# Patient Record
Sex: Male | Born: 1980
Health system: Southern US, Community
[De-identification: ages and names within clinical notes are randomized; demographics above are authoritative.]

## PROBLEM LIST (undated history)

## (undated) ENCOUNTER — Ambulatory Visit: Payer: Medicaid Other | Source: Home / Self Care

## (undated) DIAGNOSIS — I10 Essential (primary) hypertension: Secondary | ICD-10-CM

## (undated) DIAGNOSIS — K5792 Diverticulitis of intestine, part unspecified, without perforation or abscess without bleeding: Secondary | ICD-10-CM

## (undated) DIAGNOSIS — E119 Type 2 diabetes mellitus without complications: Secondary | ICD-10-CM

## (undated) HISTORY — PX: CHEST TUBE INSERTION: SHX231

---
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
## 2000-04-03 ENCOUNTER — Emergency Department (HOSPITAL_COMMUNITY): Admission: EM | Admit: 2000-04-03 | Discharge: 2000-04-03 | Payer: Self-pay | Admitting: Emergency Medicine

## 2003-05-01 ENCOUNTER — Emergency Department (HOSPITAL_COMMUNITY): Admission: EM | Admit: 2003-05-01 | Discharge: 2003-05-01 | Payer: Self-pay | Admitting: Emergency Medicine

## 2003-12-31 ENCOUNTER — Emergency Department (HOSPITAL_COMMUNITY): Admission: EM | Admit: 2003-12-31 | Discharge: 2003-12-31 | Payer: Self-pay | Admitting: Emergency Medicine

## 2004-03-04 ENCOUNTER — Emergency Department (HOSPITAL_COMMUNITY): Admission: EM | Admit: 2004-03-04 | Discharge: 2004-03-04 | Payer: Self-pay | Admitting: Emergency Medicine

## 2005-11-07 ENCOUNTER — Emergency Department (HOSPITAL_COMMUNITY): Admission: EM | Admit: 2005-11-07 | Discharge: 2005-11-07 | Payer: Self-pay | Admitting: *Deleted

## 2006-09-06 ENCOUNTER — Emergency Department (HOSPITAL_COMMUNITY): Admission: EM | Admit: 2006-09-06 | Discharge: 2006-09-06 | Payer: Self-pay | Admitting: Emergency Medicine

## 2008-06-25 ENCOUNTER — Emergency Department (HOSPITAL_COMMUNITY): Admission: EM | Admit: 2008-06-25 | Discharge: 2008-06-25 | Payer: Self-pay | Admitting: Emergency Medicine

## 2010-05-26 LAB — CBC
HCT: 45.2 % (ref 39.0–52.0)
Hemoglobin: 15.2 g/dL (ref 13.0–17.0)
MCHC: 33.8 g/dL (ref 30.0–36.0)
RBC: 5.66 MIL/uL (ref 4.22–5.81)
RDW: 14.2 % (ref 11.5–15.5)

## 2010-05-26 LAB — BASIC METABOLIC PANEL
CO2: 25 mEq/L (ref 19–32)
Glucose, Bld: 149 mg/dL — ABNORMAL HIGH (ref 70–99)
Potassium: 3.6 mEq/L (ref 3.5–5.1)
Sodium: 138 mEq/L (ref 135–145)

## 2010-05-26 LAB — DIFFERENTIAL
Basophils Absolute: 0 10*3/uL (ref 0.0–0.1)
Basophils Relative: 0 % (ref 0–1)
Eosinophils Relative: 3 % (ref 0–5)
Lymphocytes Relative: 46 % (ref 12–46)
Monocytes Absolute: 0.7 10*3/uL (ref 0.1–1.0)
Monocytes Relative: 12 % (ref 3–12)

## 2010-05-26 LAB — TROPONIN I: Troponin I: 0.01 ng/mL (ref 0.00–0.06)

## 2010-05-26 LAB — URINALYSIS, ROUTINE W REFLEX MICROSCOPIC
Bilirubin Urine: NEGATIVE
Hgb urine dipstick: NEGATIVE
Ketones, ur: NEGATIVE mg/dL
Nitrite: NEGATIVE
pH: 6 (ref 5.0–8.0)

## 2010-05-26 LAB — CK TOTAL AND CKMB (NOT AT ARMC): Total CK: 427 U/L — ABNORMAL HIGH (ref 7–232)

## 2010-05-26 LAB — RAPID URINE DRUG SCREEN, HOSP PERFORMED
Amphetamines: NOT DETECTED
Cocaine: POSITIVE — AB
Opiates: NOT DETECTED
Tetrahydrocannabinol: POSITIVE — AB

## 2010-06-30 NOTE — H&P (Signed)
NAME:  Alan Williams, Alan Williams NO.:  [id number]   MEDICAL RECORD NO.:  [id number]          PATIENT TYPE:  EMS   LOCATION:  MAJO                         FACILITY:  [facility]   PHYSICIAN:  Renee Ramus, MD       DATE OF BIRTH:  [birth year]   DATE OF ADMISSION:  06/25/2008  DATE OF DISCHARGE:                              HISTORY & PHYSICAL   HISTORY OF PRESENT ILLNESS:  The patient is a 30 year old male with  chest pain, which he thinks is episodic x1 month prior to admission.  The patient reports increased pain with cocaine use.  He also reports  some trouble breathing and heaviness in his chest.  The patient said  that this has been going on for approximately 4 weeks prior to  admission.  He says that he does not have any associated dyspnea on  exertion, dyspnea at baseline.  No cough, fevers, chills, night sweats,  nausea, or vomiting.  No diarrhea or constipation.  The patient has no  other medical comorbidities.  The patient does admit to recent cocaine  use.  The patient has negative EKG, negative enzymes, and a chest x-ray  that shows some pulmonary edema.   PAST MEDICAL HISTORY:  Blood pressure for which he is not treated and  drug use.   SOCIAL HISTORY:  The patient admits to using cocaine, although he says  not all the time.  The patient also reports marijuana use.   FAMILY HISTORY:  Not available.   REVIEW OF SYSTEMS:  All other comprehensive review of systems are  negative.   MEDICATIONS:  Currently none.   ALLERGIES:  He has no known drug allergies.   PHYSICAL EXAMINATION:  GENERAL:  This is a well-developed and well-  nourished black male currently in no apparent distress.  VITAL SIGNS:  Blood pressure 142/81, heart rate 91, respiratory rate 17,  and temperature 98.1.  HEENT:  No jugular venous distention or lymphadenopathy.  Oropharynx is  clear.  Mucous membranes are pink and moist.  TMs are clear bilaterally.  Pupils equal and reactive to light and  accommodation.  Extraocular  muscles are intact.  CARDIOVASCULAR:  He has a regular rate and rhythm without murmurs, rubs,  or gallops.  PULMONARY:  Lungs are clear to auscultation bilaterally.  ABDOMEN:  Soft, moderately obese, nontender, nondistended without  hepatosplenomegaly.  Bowel sounds are present.  He has no rebound or  guarding.  EXTREMITIES:  He has no clubbing, cyanosis, or edema.  He has good  peripheral pulses in dorsalis pedis and radial arteries.  He is able to  move all extremities.  NEUROLOGIC:  Cranial nerves II-XII are grossly intact.  He has no focal  neurological deficits.   STUDIES:  1. Chest x-ray shows mild pulmonary edema bilaterally.  2. EKG shows normal sinus rhythm with concave down ST segment      elevations.   LABORATORY DATA:  White count 5.6, H and H 15/45, MCV 80, plates 621.  Sodium 138, potassium 2.6, chloride 102, bicarb 28, BUN 12, creatinine  1.0, glucose 149.  UA  is clear.   ASSESSMENT:  1. Chest pain, currently resolved.  We think this is secondary to      transient pulmonary edema from cocaine use.  The patient has been      advised not to use cocaine.  He has been advised to follow up with      an established care at [facility] for further evaluation and      treatment if needed.  Certainly, his blood pressure and elevated      blood sugar needs to be further investigated and he should be      screened for cardiac risk factors including high cholesterol.  2. Drug use as above.  3. Increased blood glucose as above.  4. Hypertension as above.   DISPOSITION:  The patient will be discharged to home with instructions  to follow up with primary care physician.  H and P was constructed by  reviewing past medical history, conferring with emergency medical room  physician, and reviewing the emergency medical record.   TIME SPENT:  1 hour.      Renee Ramus, MD  Electronically Signed     JF/MEDQ  D:  06/25/2008  T:  06/26/2008  Job:   [id number]

## 2010-07-02 ENCOUNTER — Emergency Department (HOSPITAL_COMMUNITY)
Admission: EM | Admit: 2010-07-02 | Discharge: 2010-07-02 | Discharge status: Against Medical Advice | Payer: Self-pay | Attending: Emergency Medicine | Admitting: Emergency Medicine

## 2010-07-02 ENCOUNTER — Emergency Department (HOSPITAL_COMMUNITY): Payer: Self-pay

## 2010-07-02 DIAGNOSIS — R0602 Shortness of breath: Secondary | ICD-10-CM | POA: Insufficient documentation

## 2010-08-26 ENCOUNTER — Inpatient Hospital Stay (HOSPITAL_COMMUNITY)
Admission: EM | Admit: 2010-08-26 | Discharge: 2010-08-28 | DRG: 313 | Disposition: A | Discharge status: Home / Self Care | Payer: Self-pay | Attending: Internal Medicine | Admitting: Internal Medicine

## 2010-08-26 ENCOUNTER — Emergency Department (HOSPITAL_COMMUNITY): Payer: Self-pay

## 2010-08-26 DIAGNOSIS — K219 Gastro-esophageal reflux disease without esophagitis: Secondary | ICD-10-CM | POA: Diagnosis present

## 2010-08-26 DIAGNOSIS — F17200 Nicotine dependence, unspecified, uncomplicated: Secondary | ICD-10-CM | POA: Diagnosis present

## 2010-08-26 DIAGNOSIS — I10 Essential (primary) hypertension: Secondary | ICD-10-CM | POA: Diagnosis present

## 2010-08-26 DIAGNOSIS — T405X1A Poisoning by cocaine, accidental (unintentional), initial encounter: Secondary | ICD-10-CM | POA: Diagnosis present

## 2010-08-26 DIAGNOSIS — R0789 Other chest pain: Principal | ICD-10-CM | POA: Diagnosis present

## 2010-08-26 LAB — BASIC METABOLIC PANEL
BUN: 16 mg/dL (ref 6–23)
CO2: 29 mEq/L (ref 19–32)
Chloride: 99 mEq/L (ref 96–112)
Creatinine, Ser: 1.09 mg/dL (ref 0.50–1.35)
GFR calc Af Amer: >60 mL/min (ref >60)
Potassium: 3.6 mEq/L (ref 3.5–5.1)

## 2010-08-26 LAB — CBC
HCT: 45.6 % (ref 39.0–52.0)
MCH: 26.5 pg (ref 26.0–34.0)
MCHC: 34 g/dL (ref 30.0–36.0)
MCV: 78.1 fL (ref 78.0–100.0)
Platelets: 279 10*3/uL (ref 150–400)
RDW: 13.8 % (ref 11.5–15.5)
WBC: 8.1 10*3/uL (ref 4.0–10.5)

## 2010-08-26 LAB — DIFFERENTIAL
Eosinophils Absolute: 0.2 10*3/uL (ref 0.0–0.7)
Eosinophils Relative: 2 % (ref 0–5)
Lymphocytes Relative: 45 % (ref 12–46)
Lymphs Abs: 3.6 10*3/uL (ref 0.7–4.0)
Monocytes Absolute: 0.9 10*3/uL (ref 0.1–1.0)
Monocytes Relative: 11 % (ref 3–12)

## 2010-08-26 LAB — CK TOTAL AND CKMB (NOT AT ARMC): Relative Index: 0.6 (ref 0.0–2.5)

## 2010-08-27 DIAGNOSIS — R072 Precordial pain: Secondary | ICD-10-CM

## 2010-08-27 LAB — URINALYSIS, ROUTINE W REFLEX MICROSCOPIC
Bilirubin Urine: NEGATIVE
Ketones, ur: NEGATIVE mg/dL
Leukocytes, UA: NEGATIVE
Nitrite: NEGATIVE
Specific Gravity, Urine: 1.018 (ref 1.005–1.030)
Urobilinogen, UA: 0.2 mg/dL (ref 0.0–1.0)

## 2010-08-27 LAB — CARDIAC PANEL(CRET KIN+CKTOT+MB+TROPI)
CK, MB: 3 ng/mL (ref 0.3–4.0)
Relative Index: 0.7 (ref 0.0–2.5)
Troponin I: <0.3 ng/mL (ref <0.30)
Troponin I: <0.3 ng/mL (ref <0.30)

## 2010-08-27 LAB — CBC
MCH: 25.3 pg — ABNORMAL LOW (ref 26.0–34.0)
MCV: 79.1 fL (ref 78.0–100.0)
Platelets: 255 10*3/uL (ref 150–400)
RBC: 5.7 MIL/uL (ref 4.22–5.81)
RDW: 13.7 % (ref 11.5–15.5)
WBC: 7.5 10*3/uL (ref 4.0–10.5)

## 2010-08-27 LAB — URINE DRUGS OF ABUSE SCREEN W ALC, ROUTINE (REF LAB)
Benzodiazepines.: NEGATIVE
Ethyl Alcohol: <10 mg/dL (ref <10)
Opiate Screen, Urine: NEGATIVE
Phencyclidine (PCP): NEGATIVE

## 2010-08-27 LAB — COMPREHENSIVE METABOLIC PANEL
Albumin: 3.6 g/dL (ref 3.5–5.2)
Alkaline Phosphatase: 57 U/L (ref 39–117)
BUN: 15 mg/dL (ref 6–23)
CO2: 25 mEq/L (ref 19–32)
Chloride: 102 mEq/L (ref 96–112)
GFR calc non Af Amer: >60 mL/min (ref >60)
Potassium: 4.5 mEq/L (ref 3.5–5.1)
Total Bilirubin: 0.4 mg/dL (ref 0.3–1.2)

## 2010-08-27 LAB — LIPID PANEL
Cholesterol: 143 mg/dL (ref 0–200)
Total CHOL/HDL Ratio: 3.6 RATIO
Triglycerides: 249 mg/dL — ABNORMAL HIGH (ref <150)
VLDL: 50 mg/dL — ABNORMAL HIGH (ref 0–40)

## 2010-08-27 LAB — MAGNESIUM: Magnesium: 2 mg/dL (ref 1.5–2.5)
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
## 2010-08-28 LAB — BASIC METABOLIC PANEL
BUN: 13 mg/dL (ref 6–23)
Calcium: 9.5 mg/dL (ref 8.4–10.5)
Creatinine, Ser: 1.16 mg/dL (ref 0.50–1.35)
GFR calc Af Amer: >60 mL/min (ref >60)
GFR calc non Af Amer: >60 mL/min (ref >60)

## 2010-08-28 LAB — CBC
MCHC: 31.9 g/dL (ref 30.0–36.0)
RDW: 13.6 % (ref 11.5–15.5)
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
## 2010-09-01 LAB — COCAINE, URINE, CONFIRMATION: Benzoylecgonine GC/MS Conf: 8192 NG/ML — ABNORMAL HIGH

## 2010-09-01 LAB — THC (MARIJUANA), URINE, CONFIRMATION: Marijuana, Ur-Confirmation: 36 NG/ML — ABNORMAL HIGH

## 2012-02-28 ENCOUNTER — Emergency Department (HOSPITAL_COMMUNITY)
Admission: EM | Admit: 2012-02-28 | Discharge: 2012-02-28 | Disposition: A | Discharge status: Home / Self Care | Payer: Self-pay | Attending: Emergency Medicine | Admitting: Emergency Medicine

## 2012-02-28 ENCOUNTER — Encounter (HOSPITAL_COMMUNITY): Payer: Self-pay | Admitting: Emergency Medicine

## 2012-02-28 DIAGNOSIS — H109 Unspecified conjunctivitis: Secondary | ICD-10-CM | POA: Insufficient documentation

## 2012-02-28 DIAGNOSIS — H5789 Other specified disorders of eye and adnexa: Secondary | ICD-10-CM | POA: Insufficient documentation

## 2012-02-28 DIAGNOSIS — F17200 Nicotine dependence, unspecified, uncomplicated: Secondary | ICD-10-CM | POA: Insufficient documentation

## 2012-02-28 DIAGNOSIS — H53149 Visual discomfort, unspecified: Secondary | ICD-10-CM | POA: Insufficient documentation

## 2012-02-28 DIAGNOSIS — I10 Essential (primary) hypertension: Secondary | ICD-10-CM | POA: Insufficient documentation

## 2012-02-28 DIAGNOSIS — R0982 Postnasal drip: Secondary | ICD-10-CM | POA: Insufficient documentation

## 2012-02-28 DIAGNOSIS — R6889 Other general symptoms and signs: Secondary | ICD-10-CM | POA: Insufficient documentation

## 2012-02-28 DIAGNOSIS — J3489 Other specified disorders of nose and nasal sinuses: Secondary | ICD-10-CM | POA: Insufficient documentation

## 2012-02-28 DIAGNOSIS — J069 Acute upper respiratory infection, unspecified: Secondary | ICD-10-CM | POA: Insufficient documentation

## 2012-02-28 HISTORY — DX: Essential (primary) hypertension: I10

## 2012-02-28 MED ORDER — POLYMYXIN B-TRIMETHOPRIM 10000-0.1 UNIT/ML-% OP SOLN: 1.0000 [drp] | OPHTHALMIC | Status: DC

## 2012-02-28 NOTE — ED Provider Notes (Signed)
History     CSN: [id number]  Arrival date & time 02/28/12  4098   First MD Initiated Contact with Patient 02/28/12 [phone]      Chief Complaint  Patient presents with  . Conjunctivitis    (Consider location/radiation/quality/duration/timing/severity/associated sxs/prior treatment) Patient is a 32 y.o. male presenting with conjunctivitis. The history is provided by the patient.  Conjunctivitis  The current episode started yesterday. The onset was sudden. The problem has been gradually worsening. The problem is moderate. Nothing relieves the symptoms. The symptoms are aggravated by light (and visine). Associated symptoms include eye itching, photophobia, congestion, rhinorrhea, eye discharge and eye redness. Pertinent negatives include no fever, no ear pain, no headaches, no sore throat, no neck pain and no wheezing.  the patient states that he was exposed to his niece 2 days prior to onset, whom he believes had pink eye.  He awoke on Sunday with matted eyelids and redness, itching and irritation.  He also has a cold, with runny nose and nasal congestion.  He has noticed yellow "gunk" in the corners of his eyes. He states he has no loss of vision or visual disturbances.  His eyes are itchy, red and sensitive to light.     Past Medical History  Diagnosis Date  . Hypertension     Past Surgical History  Procedure Date  . Chest tube insertion     No family history on file.  History  Substance Use Topics  . Smoking status: Current Some Day Smoker  . Smokeless tobacco: Not on file  . Alcohol Use: Yes      Review of Systems  Constitutional: Negative for fever and appetite change.  HENT: Positive for congestion, rhinorrhea and postnasal drip. Negative for ear pain, sore throat, neck pain and neck stiffness.   Eyes: Positive for photophobia, discharge, redness and itching. Negative for visual disturbance.  Respiratory: Negative for chest tightness, shortness of breath and wheezing.     Cardiovascular: Negative for chest pain and palpitations.  Neurological: Negative for dizziness and headaches.    Allergies  Review of patient's allergies indicates not on file.  Home Medications  No current outpatient prescriptions on file.  BP 139/108  Pulse 80  Temp 98 F (36.7 C) (Oral)  Resp 18  Ht 5\' 6"  (1.676 m)  Wt 261 lb (118.389 kg)  BMI 42.13 kg/m2  SpO2 97%  Physical Exam  Constitutional: He is oriented to person, place, and time. He appears well-developed and well-nourished.  HENT:  Right Ear: Tympanic membrane normal.  Left Ear: Tympanic membrane normal.  Mouth/Throat: Oropharynx is clear and moist. No oropharyngeal exudate or posterior oropharyngeal erythema.  Eyes: EOM are normal. Pupils are equal, round, and reactive to light. Right eye exhibits discharge. Left eye exhibits discharge. Right conjunctiva is injected. Left conjunctiva is injected.  Cardiovascular: Normal rate, regular rhythm and normal heart sounds.   Pulmonary/Chest: Effort normal and breath sounds normal.  Lymphadenopathy:    He has no cervical adenopathy.  Neurological: He is alert and oriented to person, place, and time.  Skin: Skin is warm and dry.    ED Course  Procedures (including critical care time)  Labs Reviewed - No data to display No results found.   No diagnosis found. 1. Bilateral conjunctivitis 2. Viral URI   MDM  Patient has conjunctivitis exposure and presents with bilateral red eyes, yellow discharge, itching, no vision changes.  Visual acuity 20/25 bilaterally.  Given antibiotic eye drops.  Instructed to return to ED  if not better in 3-5 days on antibiotics.  Also has URI, probably viral.  Afebrile.             Kayelyn Lemon, PA-C 02/28/12 1028  Kehaulani Fruin, PA-C 02/28/12 1030  Wealthy Danielski, PA-C 02/28/12 1037

## 2012-02-28 NOTE — ED Provider Notes (Signed)
Medical screening examination/treatment/procedure(s) were performed by non-physician practitioner and as supervising physician I was immediately available for consultation/collaboration.   Charles B. Sheldon, MD 02/28/12 1530 

## 2012-02-28 NOTE — ED Notes (Signed)
C/o bilat eye pain and drainage X2d, no other complaints, NAD

## 2012-02-28 NOTE — ED Notes (Signed)
Pt was around a child that had "pink eye" this weekend. Pt woke with bilateral eye redness, scratchiness and drainage.

## 2013-04-06 ENCOUNTER — Encounter (HOSPITAL_COMMUNITY): Payer: Self-pay | Admitting: Emergency Medicine

## 2013-04-06 ENCOUNTER — Emergency Department (HOSPITAL_COMMUNITY)
Admission: EM | Admit: 2013-04-06 | Discharge: 2013-04-06 | Disposition: A | Discharge status: Home / Self Care | Payer: Self-pay | Attending: Emergency Medicine | Admitting: Emergency Medicine

## 2013-04-06 ENCOUNTER — Emergency Department (HOSPITAL_COMMUNITY): Payer: Self-pay

## 2013-04-06 DIAGNOSIS — R52 Pain, unspecified: Secondary | ICD-10-CM | POA: Insufficient documentation

## 2013-04-06 DIAGNOSIS — M25569 Pain in unspecified knee: Secondary | ICD-10-CM | POA: Insufficient documentation

## 2013-04-06 DIAGNOSIS — F17200 Nicotine dependence, unspecified, uncomplicated: Secondary | ICD-10-CM | POA: Insufficient documentation

## 2013-04-06 DIAGNOSIS — I10 Essential (primary) hypertension: Secondary | ICD-10-CM | POA: Insufficient documentation

## 2013-04-06 MED ORDER — HYDROCODONE-ACETAMINOPHEN 5-325 MG PO TABS: 1.0000 | ORAL_TABLET | Freq: Four times a day (QID) | ORAL | Status: DC | PRN

## 2013-04-06 MED ORDER — PREDNISONE 50 MG PO TABS: 50.0000 mg | ORAL_TABLET | Freq: Every day | ORAL | Status: DC

## 2013-04-06 NOTE — Discharge Instructions (Signed)
Return here as needed. Follow up with the orthopedist provided. Ice and heat to the knee.  °

## 2013-04-06 NOTE — ED Provider Notes (Signed)
CSN: [id number]     Arrival date & time 04/06/13  1653 History   First MD Initiated Contact with Patient 04/06/13 1750     Chief Complaint  Patient presents with  . Knee Pain     (Consider location/radiation/quality/duration/timing/severity/associated sxs/prior Treatment) Patient is a 33 y.o. male presenting with knee pain.  Knee Pain  33 yo black male with PMH with hypertension presents to the ED with left knee pain for 2 weeks. Patient states that his pain is sharp 4/10 and exacerbated when he walks. He doesn't remember a specific injury that caused this. Patient is able to ambulate but describes it being painful. He describes that  Patient denies nausea, vomiting, fever, or myalgias.    Past Medical History  Diagnosis Date  . Hypertension    Past Surgical History  Procedure Laterality Date  . Chest tube insertion     No family history on file. History  Substance Use Topics  . Smoking status: Current Some Day Smoker  . Smokeless tobacco: Not on file  . Alcohol Use: Yes    Review of Systems All other systems negative except as documented in the HPI. All pertinent positives and negatives as reviewed in the HPI.    Allergies  Review of patient's allergies indicates no known allergies.  Home Medications   Current Outpatient Rx  Name  Route  Sig  Dispense  Refill  . acetaminophen (TYLENOL) 325 MG tablet   Oral   Take 650 mg by mouth every 6 (six) hours as needed (pain).          BP 161/97  Pulse 98  Temp(Src) 97.9 F (36.6 C) (Oral)  Resp 18  SpO2 98% Physical Exam  Nursing note and vitals reviewed. Constitutional: He is oriented to person, place, and time. He appears well-developed and well-nourished. No distress.  HENT:  Head: Normocephalic and atraumatic.  Neck: Normal range of motion. Neck supple.  Cardiovascular: Normal rate and regular rhythm.   Pulmonary/Chest: Effort normal and breath sounds normal.  Musculoskeletal:       Left knee: He exhibits  swelling and effusion. He exhibits normal range of motion, no laceration, no erythema, normal alignment, no LCL laxity and no MCL laxity. Tenderness found.  Neurological: He is alert and oriented to person, place, and time.  Skin: Skin is warm and dry. He is not diaphoretic.    ED Course  Procedures (including critical care time) Labs Review Labs Reviewed - No data to display Imaging Review Dg Knee Complete 4 Views Left  04/06/2013   CLINICAL DATA:  Left knee pain.  EXAM: LEFT KNEE - COMPLETE 4+ VIEW  COMPARISON:  None.  FINDINGS: There is no evidence of fracture, dislocation, or joint effusion. There is no evidence of arthropathy or other focal bone abnormality. Soft tissues are unremarkable.  IMPRESSION: Negative.   Electronically Signed   By: Myles RosenthalJohn  Stahl M.D.   On: 04/06/2013 18:15   Return here as needed. Follow up with ortho as directed. Ice and elevate the knee.    Carlyle Dollyhristopher W Laquida Cotrell, PA-C 04/06/13 [phone]

## 2013-04-06 NOTE — ED Provider Notes (Signed)
Medical screening examination/treatment/procedure(s) were performed by non-physician practitioner and as supervising physician I was immediately available for consultation/collaboration.  EKG Interpretation   None         Rolan BuccoMelanie Krisy Dix, MD 04/06/13 2015

## 2013-04-06 NOTE — ED Notes (Signed)
Pt reports left knee pain that has been present since an injury in high school. Pt reports that about two weeks ago his pain worsened. Pt denies falling, however states that he may have stepped off a step wrong. Pt is A/O x4, in NAD, and vitals are WDL.

## 2014-04-23 ENCOUNTER — Emergency Department (HOSPITAL_COMMUNITY): Payer: Medicaid Other

## 2014-04-23 ENCOUNTER — Encounter (HOSPITAL_COMMUNITY): Payer: Self-pay | Admitting: Emergency Medicine

## 2014-04-23 ENCOUNTER — Inpatient Hospital Stay (HOSPITAL_COMMUNITY)
Admission: EM | Admit: 2014-04-23 | Discharge: 2014-04-25 | DRG: 392 | Disposition: A | Discharge status: Home / Self Care | Payer: Medicaid Other | Attending: Internal Medicine | Admitting: Internal Medicine

## 2014-04-23 DIAGNOSIS — E860 Dehydration: Secondary | ICD-10-CM | POA: Diagnosis present

## 2014-04-23 DIAGNOSIS — K5790 Diverticulosis of intestine, part unspecified, without perforation or abscess without bleeding: Secondary | ICD-10-CM

## 2014-04-23 DIAGNOSIS — N182 Chronic kidney disease, stage 2 (mild): Secondary | ICD-10-CM | POA: Diagnosis present

## 2014-04-23 DIAGNOSIS — K5900 Constipation, unspecified: Secondary | ICD-10-CM | POA: Diagnosis present

## 2014-04-23 DIAGNOSIS — F17210 Nicotine dependence, cigarettes, uncomplicated: Secondary | ICD-10-CM | POA: Diagnosis present

## 2014-04-23 DIAGNOSIS — N179 Acute kidney failure, unspecified: Secondary | ICD-10-CM | POA: Diagnosis present

## 2014-04-23 DIAGNOSIS — K5720 Diverticulitis of large intestine with perforation and abscess without bleeding: Principal | ICD-10-CM | POA: Diagnosis present

## 2014-04-23 DIAGNOSIS — Z6839 Body mass index (BMI) 39.0-39.9, adult: Secondary | ICD-10-CM

## 2014-04-23 DIAGNOSIS — R12 Heartburn: Secondary | ICD-10-CM

## 2014-04-23 DIAGNOSIS — K219 Gastro-esophageal reflux disease without esophagitis: Secondary | ICD-10-CM | POA: Diagnosis present

## 2014-04-23 DIAGNOSIS — Z79899 Other long term (current) drug therapy: Secondary | ICD-10-CM

## 2014-04-23 DIAGNOSIS — E119 Type 2 diabetes mellitus without complications: Secondary | ICD-10-CM

## 2014-04-23 DIAGNOSIS — D72829 Elevated white blood cell count, unspecified: Secondary | ICD-10-CM | POA: Diagnosis present

## 2014-04-23 DIAGNOSIS — E669 Obesity, unspecified: Secondary | ICD-10-CM

## 2014-04-23 HISTORY — DX: Type 2 diabetes mellitus without complications: E11.9

## 2014-04-23 LAB — CBC WITH DIFFERENTIAL/PLATELET
Basophils Absolute: 0 10*3/uL (ref 0.0–0.1)
Basophils Relative: 0 % (ref 0–1)
Eosinophils Absolute: 0.1 10*3/uL (ref 0.0–0.7)
Eosinophils Relative: 1 % (ref 0–5)
HEMATOCRIT: 45.5 % (ref 39.0–52.0)
Hemoglobin: 14.9 g/dL (ref 13.0–17.0)
LYMPHS PCT: 14 % (ref 12–46)
Lymphs Abs: 2.1 10*3/uL (ref 0.7–4.0)
MCH: 26.4 pg (ref 26.0–34.0)
MCHC: 32.7 g/dL (ref 30.0–36.0)
MCV: 80.5 fL (ref 78.0–100.0)
MONO ABS: 1.7 10*3/uL — AB (ref 0.1–1.0)
MONOS PCT: 11 % (ref 3–12)
Neutro Abs: 11.1 10*3/uL — ABNORMAL HIGH (ref 1.7–7.7)
Neutrophils Relative %: 74 % (ref 43–77)
Platelets: 282 10*3/uL (ref 150–400)
RBC: 5.65 MIL/uL (ref 4.22–5.81)
RDW: 13.4 % (ref 11.5–15.5)
WBC: 14.9 10*3/uL — ABNORMAL HIGH (ref 4.0–10.5)

## 2014-04-23 LAB — COMPREHENSIVE METABOLIC PANEL
ALT: 27 U/L (ref 0–53)
AST: 28 U/L (ref 0–37)
Albumin: 4.4 g/dL (ref 3.5–5.2)
Alkaline Phosphatase: 69 U/L (ref 39–117)
Anion gap: 9 (ref 5–15)
BILIRUBIN TOTAL: 0.8 mg/dL (ref 0.3–1.2)
BUN: 16 mg/dL (ref 6–23)
CALCIUM: 9.4 mg/dL (ref 8.4–10.5)
CHLORIDE: 105 mmol/L (ref 96–112)
CO2: 23 mmol/L (ref 19–32)
Creatinine, Ser: 1.65 mg/dL — ABNORMAL HIGH (ref 0.50–1.35)
GFR calc Af Amer: 61 mL/min — ABNORMAL LOW (ref >90)
GFR calc non Af Amer: 52 mL/min — ABNORMAL LOW (ref >90)
GLUCOSE: 103 mg/dL — AB (ref 70–99)
POTASSIUM: 3.5 mmol/L (ref 3.5–5.1)
SODIUM: 137 mmol/L (ref 135–145)
Total Protein: 8.6 g/dL — ABNORMAL HIGH (ref 6.0–8.3)

## 2014-04-23 LAB — URINALYSIS, ROUTINE W REFLEX MICROSCOPIC
BILIRUBIN URINE: NEGATIVE
GLUCOSE, UA: NEGATIVE mg/dL
HGB URINE DIPSTICK: NEGATIVE
KETONES UR: NEGATIVE mg/dL
Leukocytes, UA: NEGATIVE
Nitrite: NEGATIVE
PROTEIN: 30 mg/dL — AB
Specific Gravity, Urine: 1.015 (ref 1.005–1.030)
Urobilinogen, UA: 0.2 mg/dL (ref 0.0–1.0)
pH: 5.5 (ref 5.0–8.0)

## 2014-04-23 LAB — URINE MICROSCOPIC-ADD ON

## 2014-04-23 LAB — LIPASE, BLOOD: LIPASE: 19 U/L (ref 11–59)

## 2014-04-23 LAB — CBG MONITORING, ED: Glucose-Capillary: 94 mg/dL (ref 70–99)

## 2014-04-23 MED ORDER — IOHEXOL 300 MG/ML  SOLN
50.0000 mL | Freq: Once | INTRAMUSCULAR | Status: AC | PRN
  Administered 2014-04-23: 50 mL via ORAL

## 2014-04-23 MED ORDER — IOHEXOL 300 MG/ML  SOLN
100.0000 mL | Freq: Once | INTRAMUSCULAR | Status: AC | PRN
  Administered 2014-04-23: 100 mL via INTRAVENOUS

## 2014-04-23 MED ORDER — MORPHINE SULFATE 4 MG/ML IJ SOLN
6.0000 mg | Freq: Once | INTRAMUSCULAR | Status: AC
  Administered 2014-04-23: 6 mg via INTRAVENOUS
  Filled 2014-04-23: qty 2

## 2014-04-23 MED ORDER — METRONIDAZOLE IN NACL 5-0.79 MG/ML-% IV SOLN
500.0000 mg | Freq: Once | INTRAVENOUS | Status: AC
  Administered 2014-04-23: 500 mg via INTRAVENOUS
  Filled 2014-04-23: qty 100

## 2014-04-23 MED ORDER — ONDANSETRON HCL 4 MG/2ML IJ SOLN
4.0000 mg | Freq: Once | INTRAMUSCULAR | Status: AC
  Administered 2014-04-23: 4 mg via INTRAVENOUS
  Filled 2014-04-23: qty 2

## 2014-04-23 MED ORDER — CIPROFLOXACIN IN D5W 400 MG/200ML IV SOLN
400.0000 mg | Freq: Once | INTRAVENOUS | Status: AC
  Administered 2014-04-23: 400 mg via INTRAVENOUS
  Filled 2014-04-23: qty 200

## 2014-04-23 NOTE — Progress Notes (Signed)
EDCM spoke to patient at bedside.  Patient confirms his pcp is located at the [facility].  System updated.

## 2014-04-23 NOTE — ED Notes (Signed)
Pt c/o abd pain that started on Sunday while at work.  Pain is now radiating to his back. Pt states that pain is constant and felt nausea but hasnt v/d.  Pt denies any injury that could have caused the pain.  Pt states that he has been taking Ibuprofen for the pain and his fever.

## 2014-04-23 NOTE — ED Notes (Signed)
LUQ and LLQ pain

## 2014-04-23 NOTE — ED Notes (Signed)
Patient c/o lower abdominal pain that started on Sunday. Patient states he took a laxative on Sunday to try to decrease discomfort. Patient states deep breathing and movement increases pain. A&OX4 at this time. Patient also states low grade fever

## 2014-04-23 NOTE — ED Notes (Signed)
Blood sugar--94
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
## 2014-04-24 ENCOUNTER — Encounter (HOSPITAL_COMMUNITY): Payer: Self-pay | Admitting: General Practice

## 2014-04-24 DIAGNOSIS — E119 Type 2 diabetes mellitus without complications: Secondary | ICD-10-CM

## 2014-04-24 DIAGNOSIS — N179 Acute kidney failure, unspecified: Secondary | ICD-10-CM | POA: Diagnosis present

## 2014-04-24 DIAGNOSIS — R12 Heartburn: Secondary | ICD-10-CM

## 2014-04-24 DIAGNOSIS — D72829 Elevated white blood cell count, unspecified: Secondary | ICD-10-CM | POA: Diagnosis present

## 2014-04-24 DIAGNOSIS — Z79899 Other long term (current) drug therapy: Secondary | ICD-10-CM | POA: Diagnosis not present

## 2014-04-24 DIAGNOSIS — K5720 Diverticulitis of large intestine with perforation and abscess without bleeding: Secondary | ICD-10-CM | POA: Diagnosis not present

## 2014-04-24 DIAGNOSIS — E669 Obesity, unspecified: Secondary | ICD-10-CM | POA: Diagnosis present

## 2014-04-24 DIAGNOSIS — R1030 Lower abdominal pain, unspecified: Secondary | ICD-10-CM | POA: Diagnosis not present

## 2014-04-24 DIAGNOSIS — F17210 Nicotine dependence, cigarettes, uncomplicated: Secondary | ICD-10-CM | POA: Diagnosis present

## 2014-04-24 DIAGNOSIS — K5790 Diverticulosis of intestine, part unspecified, without perforation or abscess without bleeding: Secondary | ICD-10-CM

## 2014-04-24 DIAGNOSIS — Z6839 Body mass index (BMI) 39.0-39.9, adult: Secondary | ICD-10-CM | POA: Diagnosis not present

## 2014-04-24 DIAGNOSIS — K219 Gastro-esophageal reflux disease without esophagitis: Secondary | ICD-10-CM | POA: Diagnosis present

## 2014-04-24 DIAGNOSIS — K5900 Constipation, unspecified: Secondary | ICD-10-CM | POA: Diagnosis present

## 2014-04-24 DIAGNOSIS — E860 Dehydration: Secondary | ICD-10-CM | POA: Diagnosis present

## 2014-04-24 DIAGNOSIS — N182 Chronic kidney disease, stage 2 (mild): Secondary | ICD-10-CM | POA: Diagnosis present

## 2014-04-24 LAB — CBC
HEMATOCRIT: 42.2 % (ref 39.0–52.0)
HEMOGLOBIN: 13.7 g/dL (ref 13.0–17.0)
MCH: 26.6 pg (ref 26.0–34.0)
MCHC: 32.5 g/dL (ref 30.0–36.0)
MCV: 81.8 fL (ref 78.0–100.0)
Platelets: 262 10*3/uL (ref 150–400)
RBC: 5.16 MIL/uL (ref 4.22–5.81)
RDW: 13.6 % (ref 11.5–15.5)
WBC: 8.7 10*3/uL (ref 4.0–10.5)

## 2014-04-24 LAB — BASIC METABOLIC PANEL
Anion gap: 10 (ref 5–15)
BUN: 16 mg/dL (ref 6–23)
CO2: 25 mmol/L (ref 19–32)
CREATININE: 1.67 mg/dL — AB (ref 0.50–1.35)
Calcium: 8.6 mg/dL (ref 8.4–10.5)
Chloride: 98 mmol/L (ref 96–112)
GFR calc Af Amer: 60 mL/min — ABNORMAL LOW (ref >90)
GFR calc non Af Amer: 52 mL/min — ABNORMAL LOW (ref >90)
Glucose, Bld: 155 mg/dL — ABNORMAL HIGH (ref 70–99)
Potassium: 3.8 mmol/L (ref 3.5–5.1)
SODIUM: 133 mmol/L — AB (ref 135–145)

## 2014-04-24 LAB — GLUCOSE, CAPILLARY
GLUCOSE-CAPILLARY: 153 mg/dL — AB (ref 70–99)
GLUCOSE-CAPILLARY: 180 mg/dL — AB (ref 70–99)
Glucose-Capillary: 168 mg/dL — ABNORMAL HIGH (ref 70–99)
Glucose-Capillary: 140 mg/dL — ABNORMAL HIGH (ref 70–99)
Glucose-Capillary: 88 mg/dL (ref 70–99)

## 2014-04-24 MED ORDER — PROMETHAZINE HCL 25 MG/ML IJ SOLN: 6.2500 mg | INTRAMUSCULAR | Status: DC | PRN

## 2014-04-24 MED ORDER — SODIUM CHLORIDE 0.9 % IV SOLN
INTRAVENOUS | Status: DC
  Administered 2014-04-24: 02:00:00 via INTRAVENOUS

## 2014-04-24 MED ORDER — CEFTRIAXONE SODIUM 2 G IJ SOLR: 2.0000 g | INTRAMUSCULAR | Status: DC

## 2014-04-24 MED ORDER — ONDANSETRON 8 MG/NS 50 ML IVPB
8.0000 mg | Freq: Four times a day (QID) | INTRAVENOUS | Status: DC | PRN
  Filled 2014-04-24: qty 8

## 2014-04-24 MED ORDER — INSULIN ASPART 100 UNIT/ML ~~LOC~~ SOLN
0.0000 [IU] | SUBCUTANEOUS | Status: DC
  Administered 2014-04-24: 2 [IU] via SUBCUTANEOUS
  Administered 2014-04-24 (×3): 3 [IU] via SUBCUTANEOUS
  Administered 2014-04-25: 2 [IU] via SUBCUTANEOUS
  Administered 2014-04-25: 3 [IU] via SUBCUTANEOUS

## 2014-04-24 MED ORDER — CIPROFLOXACIN IN D5W 400 MG/200ML IV SOLN: 400.0000 mg | Freq: Two times a day (BID) | INTRAVENOUS | Status: DC

## 2014-04-24 MED ORDER — PHENOL 1.4 % MT LIQD
2.0000 | OROMUCOSAL | Status: DC | PRN
  Filled 2014-04-24: qty 177

## 2014-04-24 MED ORDER — ONDANSETRON HCL 4 MG/2ML IJ SOLN: 4.0000 mg | Freq: Four times a day (QID) | INTRAMUSCULAR | Status: DC | PRN

## 2014-04-24 MED ORDER — HYDROMORPHONE HCL 1 MG/ML IJ SOLN: 0.5000 mg | INTRAMUSCULAR | Status: DC | PRN

## 2014-04-24 MED ORDER — OXYCODONE HCL 5 MG PO TABS
5.0000 mg | ORAL_TABLET | ORAL | Status: DC | PRN
  Administered 2014-04-25: 5 mg via ORAL
  Filled 2014-04-24: qty 1

## 2014-04-24 MED ORDER — SACCHAROMYCES BOULARDII 250 MG PO CAPS
250.0000 mg | ORAL_CAPSULE | Freq: Two times a day (BID) | ORAL | Status: DC
  Administered 2014-04-24 – 2014-04-25 (×4): 250 mg via ORAL
  Filled 2014-04-24 (×8): qty 1

## 2014-04-24 MED ORDER — BISACODYL 10 MG RE SUPP: 10.0000 mg | Freq: Two times a day (BID) | RECTAL | Status: DC | PRN

## 2014-04-24 MED ORDER — LACTATED RINGERS IV BOLUS (SEPSIS): 1000.0000 mL | Freq: Once | INTRAVENOUS | Status: DC

## 2014-04-24 MED ORDER — LIP MEDEX EX OINT
1.0000 "application " | TOPICAL_OINTMENT | Freq: Two times a day (BID) | CUTANEOUS | Status: DC
  Administered 2014-04-24 – 2014-04-25 (×3): 1 via TOPICAL
  Filled 2014-04-24 (×2): qty 7

## 2014-04-24 MED ORDER — METOPROLOL TARTRATE 1 MG/ML IV SOLN
5.0000 mg | Freq: Four times a day (QID) | INTRAVENOUS | Status: DC | PRN
  Filled 2014-04-24: qty 5

## 2014-04-24 MED ORDER — HEPARIN SODIUM (PORCINE) 5000 UNIT/ML IJ SOLN
5000.0000 [IU] | Freq: Three times a day (TID) | INTRAMUSCULAR | Status: DC
  Administered 2014-04-24 – 2014-04-25 (×3): 5000 [IU] via SUBCUTANEOUS
  Filled 2014-04-24 (×6): qty 1

## 2014-04-24 MED ORDER — ACETAMINOPHEN 325 MG PO TABS: 650.0000 mg | ORAL_TABLET | Freq: Four times a day (QID) | ORAL | Status: DC | PRN

## 2014-04-24 MED ORDER — LIP MEDEX EX OINT: 1.0000 "application " | TOPICAL_OINTMENT | Freq: Two times a day (BID) | CUTANEOUS | Status: DC

## 2014-04-24 MED ORDER — METRONIDAZOLE IN NACL 5-0.79 MG/ML-% IV SOLN
500.0000 mg | Freq: Four times a day (QID) | INTRAVENOUS | Status: DC
  Filled 2014-04-24: qty 100

## 2014-04-24 MED ORDER — ACETAMINOPHEN 650 MG RE SUPP: 650.0000 mg | Freq: Four times a day (QID) | RECTAL | Status: DC | PRN

## 2014-04-24 MED ORDER — HYDROMORPHONE HCL 1 MG/ML IJ SOLN
0.5000 mg | INTRAMUSCULAR | Status: DC | PRN
  Administered 2014-04-24 – 2014-04-25 (×3): 1 mg via INTRAVENOUS
  Filled 2014-04-24 (×3): qty 1

## 2014-04-24 MED ORDER — OXYCODONE HCL 5 MG PO TABS: 5.0000 mg | ORAL_TABLET | ORAL | Status: DC | PRN

## 2014-04-24 MED ORDER — METRONIDAZOLE IN NACL 5-0.79 MG/ML-% IV SOLN: 500.0000 mg | Freq: Three times a day (TID) | INTRAVENOUS | Status: DC

## 2014-04-24 MED ORDER — DIPHENHYDRAMINE HCL 50 MG/ML IJ SOLN
12.5000 mg | Freq: Four times a day (QID) | INTRAMUSCULAR | Status: DC | PRN
Start: 2014-04-24 — End: 2014-04-25

## 2014-04-24 MED ORDER — ALUM & MAG HYDROXIDE-SIMETH 200-200-20 MG/5ML PO SUSP: 30.0000 mL | Freq: Four times a day (QID) | ORAL | Status: DC | PRN

## 2014-04-24 MED ORDER — CEFTRIAXONE SODIUM IN DEXTROSE 40 MG/ML IV SOLN
2.0000 g | Freq: Every day | INTRAVENOUS | Status: DC
  Filled 2014-04-24: qty 50

## 2014-04-24 MED ORDER — MAGIC MOUTHWASH
15.0000 mL | Freq: Four times a day (QID) | ORAL | Status: DC | PRN
Start: 2014-04-24 — End: 2014-04-25
  Filled 2014-04-24: qty 15

## 2014-04-24 MED ORDER — PANTOPRAZOLE SODIUM 40 MG PO TBEC
40.0000 mg | DELAYED_RELEASE_TABLET | Freq: Two times a day (BID) | ORAL | Status: DC
Start: 2014-04-24 — End: 2014-04-25
  Administered 2014-04-24 – 2014-04-25 (×3): 40 mg via ORAL
  Filled 2014-04-24 (×5): qty 1

## 2014-04-24 MED ORDER — ZOLPIDEM TARTRATE 5 MG PO TABS
5.0000 mg | ORAL_TABLET | Freq: Every evening | ORAL | Status: DC | PRN
Start: 2014-04-24 — End: 2014-04-25

## 2014-04-24 MED ORDER — MENTHOL 3 MG MT LOZG
1.0000 | LOZENGE | OROMUCOSAL | Status: DC | PRN
  Filled 2014-04-24: qty 9

## 2014-04-24 MED ORDER — LACTATED RINGERS IV SOLN: INTRAVENOUS | Status: DC

## 2014-04-24 MED ORDER — PIPERACILLIN-TAZOBACTAM 3.375 G IVPB
3.3750 g | Freq: Three times a day (TID) | INTRAVENOUS | Status: DC
  Administered 2014-04-24 – 2014-04-25 (×4): 3.375 g via INTRAVENOUS
  Filled 2014-04-24 (×7): qty 50

## 2014-04-24 MED ORDER — ACETAMINOPHEN 500 MG PO TABS
1000.0000 mg | ORAL_TABLET | Freq: Three times a day (TID) | ORAL | Status: DC
  Filled 2014-04-24 (×3): qty 2

## 2014-04-24 MED ORDER — LACTATED RINGERS IV BOLUS (SEPSIS)
1000.0000 mL | Freq: Three times a day (TID) | INTRAVENOUS | Status: DC | PRN
Start: 2014-04-24 — End: 2014-04-25

## 2014-04-24 MED ORDER — ONDANSETRON HCL 4 MG PO TABS: 4.0000 mg | ORAL_TABLET | Freq: Four times a day (QID) | ORAL | Status: DC | PRN

## 2014-04-24 MED ORDER — MORPHINE SULFATE 4 MG/ML IJ SOLN
6.0000 mg | Freq: Once | INTRAMUSCULAR | Status: AC
  Administered 2014-04-24: 6 mg via INTRAVENOUS
  Filled 2014-04-24: qty 2
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
## 2014-04-24 NOTE — Progress Notes (Signed)
PATIENT DETAILS Name: Alan Williams Age: 34 y.o. Sex: male Date of Birth: [birth year] Admit Date: 04/23/2014 Admitting Physician Ron Parker, MD [id number], Alyce Pagan, PA-C  Subjective: Feels better  Assessment/Plan: Principal Problem:   Diverticulitis of descending colon with microperforation: Admitted, and started on IV Zosyn. Clinically improved, started on clear liquids. Still with some amount of tenderness in the left lower quadrant. General surgery following.  Active Problems:   ? 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 Could have prerenal azotemia secondary to diverticulitis, some amount of contrast nephropathy. Continue with IV fluids, follow electrolytes. Further workup only to be done if creatinine continues to worsen.    Diabetes mellitus without complication: Continue with SSI-CBGs stable. Resume Amaryl on discharge    Obesity (BMI 30-39.9): Counseled    Suspected GERD: Continue with PPI  Disposition: Remain inpatient  Antibiotics:  See below   Anti-infectives    Start     Dose/Rate Route Frequency Ordered Stop   04/24/14 1000  ciprofloxacin (CIPRO) IVPB 400 mg  Status:  Discontinued     400 mg 200 mL/hr over 60 Minutes Intravenous Every 12 hours 04/24/14 0106 04/24/14 0147   04/24/14 0900  metroNIDAZOLE (FLAGYL) IVPB 500 mg  Status:  Discontinued     500 mg 100 mL/hr over 60 Minutes Intravenous Every 8 hours 04/24/14 0108 04/24/14 0111   04/24/14 0600  metroNIDAZOLE (FLAGYL) IVPB 500 mg  Status:  Discontinued     500 mg 100 mL/hr over 60 Minutes Intravenous Every 6 hours 04/24/14 0059 04/24/14 0155   04/24/14 0230  piperacillin-tazobactam (ZOSYN) IVPB 3.375 g     3.375 g 12.5 mL/hr over 240 Minutes Intravenous 3 times per day 04/24/14 0222     04/24/14 0200  cefTRIAXone (ROCEPHIN) 2 g in dextrose 5 % 50 mL IVPB - Premix  Status:  Discontinued    Comments:  Pharmacy may adjust dosing  strength / duration / interval for maximal efficacy   2 g 100 mL/hr over 30 Minutes Intravenous Daily at bedtime 04/24/14 0147 04/24/14 0155   04/24/14 0100  cefTRIAXone (ROCEPHIN) 2 g in dextrose 5 % 50 mL IVPB  Status:  Discontinued    Comments:  Pharmacy may adjust dosing strength / duration / interval for maximal efficacy   2 g 100 mL/hr over 30 Minutes Intravenous Every 24 hours 04/24/14 0059 04/24/14 0106   04/24/14 0000  ciprofloxacin (CIPRO) IVPB 400 mg     400 mg 200 mL/hr over 60 Minutes Intravenous  Once 04/23/14 2346 04/24/14 0059   04/24/14 0000  metroNIDAZOLE (FLAGYL) IVPB 500 mg     500 mg 100 mL/hr over 60 Minutes Intravenous  Once 04/23/14 2346 04/24/14 0059      DVT Prophylaxis: Prophylactic Heparin   Code Status: Full code  Family Communication None at bedside  Procedures:  None  CONSULTS:  general surgery  Time spent 40 minutes-which includes 50% of the time with face-to-face with patient/ family and coordinating care related to the above assessment and plan.  MEDICATIONS: Scheduled Meds: . insulin aspart  0-15 Units Subcutaneous 6 times per day  . lactated ringers  1,000 mL Intravenous Once  . lip balm  1 application Topical BID  . pantoprazole  40 mg Oral BID AC  . piperacillin-tazobactam (ZOSYN)  IV  3.375 g Intravenous 3 times per day  . saccharomyces boulardii  250 mg Oral BID   Continuous  Infusions: . sodium chloride 125 mL/hr at 04/24/14 0320  . lactated ringers     PRN Meds:.acetaminophen **OR** acetaminophen, diphenhydrAMINE, HYDROmorphone (DILAUDID) injection, lactated ringers, magic mouthwash, menthol-cetylpyridinium, metoprolol, ondansetron **OR** ondansetron (ZOFRAN) IV, oxyCODONE, zolpidem    PHYSICAL EXAM: Vital signs in last 24 hours: Filed Vitals:   04/24/14 0109 04/24/14 0202 04/24/14 0547 04/24/14 1000  BP:  140/100 133/86 146/93  Pulse:  105 86 95  Temp:  98.3 F (36.8 C) 98.7 F (37.1 C) 98 F (36.7 C)  TempSrc:   Oral Oral Oral  Resp:  18 18 18   Height: 5\' 6"  (1.676 m)     Weight: 111.177 kg (245 lb 1.6 oz)     SpO2:  100% 100% 99%    Weight change:  Filed Weights   04/24/14 0109  Weight: 111.177 kg (245 lb 1.6 oz)   Body mass index is 39.58 kg/(m^2).   Gen Exam: Awake and alert with clear speech.   Neck: Supple, No JVD.   Chest: B/L Clear.   CVS: S1 S2 Regular, no murmurs.  Abdomen: soft, BS +, mildly  Tender in LLQ, non distended.  Extremities: no edema, lower extremities warm to touch. Neurologic: Non Focal.   Skin: No Rash.   Wounds: N/A.   Intake/Output from previous day:  Intake/Output Summary (Last 24 hours) at 04/24/14 1324 Last data filed at 04/24/14 1000  Gross per 24 hour  Intake 1251.67 ml  Output    300 ml  Net 951.67 ml     LAB RESULTS: CBC  Recent Labs Lab 04/23/14 1931 04/24/14 0833  WBC 14.9* 8.7  HGB 14.9 13.7  HCT 45.5 42.2  PLT 282 262  MCV 80.5 81.8  MCH 26.4 26.6  MCHC 32.7 32.5  RDW 13.4 13.6  LYMPHSABS 2.1  --   MONOABS 1.7*  --   EOSABS 0.1  --   BASOSABS 0.0  --     Chemistries   Recent Labs Lab 04/23/14 1931 04/24/14 0833  NA 137 133*  K 3.5 3.8  CL 105 98  CO2 23 25  GLUCOSE 103* 155*  BUN 16 16  CREATININE 1.65* 1.67*  CALCIUM 9.4 8.6    CBG:  Recent Labs Lab 04/23/14 1840 04/24/14 0358 04/24/14 0754 04/24/14 1121  GLUCAP 94 168* 153* 140*    GFR Estimated Creatinine Clearance: 72.3 mL/min (by C-G formula based on Cr of 1.67).  Coagulation profile No results for input(s): INR, PROTIME in the last 168 hours.  Cardiac Enzymes No results for input(s): CKMB, TROPONINI, MYOGLOBIN in the last 168 hours.  Invalid input(s): CK  Invalid input(s): POCBNP No results for input(s): DDIMER in the last 72 hours. No results for input(s): HGBA1C in the last 72 hours. No results for input(s): CHOL, HDL, LDLCALC, TRIG, CHOLHDL, LDLDIRECT in the last 72 hours. No results for input(s): TSH, T4TOTAL, T3FREE, THYROIDAB in  the last 72 hours.  Invalid input(s): FREET3 No results for input(s): VITAMINB12, FOLATE, FERRITIN, TIBC, IRON, RETICCTPCT in the last 72 hours.  Recent Labs  04/23/14 1931  LIPASE 19    Urine Studies No results for input(s): UHGB, CRYS in the last 72 hours.  Invalid input(s): UACOL, UAPR, USPG, UPH, UTP, UGL, UKET, UBIL, UNIT, UROB, ULEU, UEPI, UWBC, URBC, UBAC, CAST, UCOM, BILUA  MICROBIOLOGY: No results found for this or any previous visit (from the past 240 hour(s)).  RADIOLOGY STUDIES/RESULTS: Ct Abdomen Pelvis W Contrast  04/23/2014   CLINICAL DATA:  Abdominal pain starting on Sunday  while at work. Pain radiates to the back. Nausea. No injury.  EXAM: CT ABDOMEN AND PELVIS WITH CONTRAST  TECHNIQUE: Multidetector CT imaging of the abdomen and pelvis was performed using the standard protocol following bolus administration of intravenous contrast.  CONTRAST:  OMNIPAQUE IOHEXOL 300 MG/ML SOLN, 50mL OMNIPAQUE IOHEXOL 300 MG/ML SOLN  COMPARISON:  None.  FINDINGS: Lung bases are clear. Sub cm low-attenuation lesion in the dome of the liver is additional characterize but likely to represent cyst or hemangioma. No other focal liver lesions identified. The liver, spleen, gallbladder, pancreas, adrenal glands, kidneys, abdominal aorta, inferior vena cava, and retroperitoneal lymph nodes are unremarkable. The stomach, small bowel, and colon are decompressed. There is diverticulosis of the descending colon with focal extraluminal gas adjacent to the mid descending colon and associated inflammatory infiltration along the pericolonic fat and iliopsoas region. This is consistent with acute diverticulitis with microperforation. No free air or free fluid in the abdomen.  Pelvis: Prostate gland is not enlarged. Bladder wall is not thickened. Appendix is normal. No pelvic mass or lymphadenopathy. No destructive bone lesions.  IMPRESSION: Diverticulitis in the mid descending colon with focal pericolonic  gas consistent with micro perforation and diffuse inflammatory infiltration. No specific abscess.   Electronically Signed   By: Burman Nieves M.D.   On: 04/23/2014 23:24    Jeoffrey Massed, MD  [facility] Pager:[phone]  If 7PM-7AM, please contact night-coverage [email or web address] Password [email or web address] 04/24/2014, 1:24 PM   LOS: 0 days

## 2014-04-24 NOTE — Progress Notes (Signed)
Patient ID: Alan Williams, male   DOB: Apr 19, 1980, 34 y.o.   MRN: [id number]    Subjective: Pt feels 100% better today he says.  Significantly less pain  Objective: Vital signs in last 24 hours: Temp:  [98.2 F (36.8 C)-98.7 F (37.1 C)] 98.7 F (37.1 C) (03/09 0547) Pulse Rate:  [86-111] 86 (03/09 0547) Resp:  [13-20] 18 (03/09 0547) BP: (133-140)/(86-100) 133/86 mmHg (03/09 0547) SpO2:  [96 %-100 %] 100 % (03/09 0547) Weight:  [245 lb 1.6 oz (111.177 kg)] 245 lb 1.6 oz (111.177 kg) (03/09 0109) Last BM Date: 04/23/14  Intake/Output from previous day: 03/08 0701 - 03/09 0700 In: 1251.7 [P.O.:780; I.V.:471.7] Out: 300 [Urine:300] Intake/Output this shift:    PE: Abd: soft, minimal LLQ tenderness, +BS, ND' Heart: regular Lungs: CTAB  Lab Results:   Recent Labs  04/23/14 1931 04/24/14 0833  WBC 14.9* 8.7  HGB 14.9 13.7  HCT 45.5 42.2  PLT 282 262   BMET  Recent Labs  04/23/14 1931 04/24/14 0833  NA 137 133*  K 3.5 3.8  CL 105 98  CO2 23 25  GLUCOSE 103* 155*  BUN 16 16  CREATININE 1.65* 1.67*  CALCIUM 9.4 8.6   PT/INR No results for input(s): LABPROT, INR in the last 72 hours. CMP     Component Value Date/Time   NA 133* 04/24/2014 0833   K 3.8 04/24/2014 0833   CL 98 04/24/2014 0833   CO2 25 04/24/2014 0833   GLUCOSE 155* 04/24/2014 0833   BUN 16 04/24/2014 0833   CREATININE 1.67* 04/24/2014 0833   CALCIUM 8.6 04/24/2014 0833   PROT 8.6* 04/23/2014 1931   ALBUMIN 4.4 04/23/2014 1931   AST 28 04/23/2014 1931   ALT 27 04/23/2014 1931   ALKPHOS 69 04/23/2014 1931   BILITOT 0.8 04/23/2014 1931   GFRNONAA 52* 04/24/2014 0833   GFRAA 60* 04/24/2014 0833   Lipase     Component Value Date/Time   LIPASE 19 04/23/2014 1931       Studies/Results: Ct Abdomen Pelvis W Contrast  04/23/2014   CLINICAL DATA:  Abdominal pain starting on Sunday while at work. Pain radiates to the back. Nausea. No injury.  EXAM: CT ABDOMEN AND PELVIS WITH CONTRAST   TECHNIQUE: Multidetector CT imaging of the abdomen and pelvis was performed using the standard protocol following bolus administration of intravenous contrast.  CONTRAST:  100mL OMNIPAQUE IOHEXOL 300 MG/ML SOLN, 50mL OMNIPAQUE IOHEXOL 300 MG/ML SOLN  COMPARISON:  None.  FINDINGS: Lung bases are clear. Sub cm low-attenuation lesion in the dome of the liver is additional characterize but likely to represent cyst or hemangioma. No other focal liver lesions identified. The liver, spleen, gallbladder, pancreas, adrenal glands, kidneys, abdominal aorta, inferior vena cava, and retroperitoneal lymph nodes are unremarkable. The stomach, small bowel, and colon are decompressed. There is diverticulosis of the descending colon with focal extraluminal gas adjacent to the mid descending colon and associated inflammatory infiltration along the pericolonic fat and iliopsoas region. This is consistent with acute diverticulitis with microperforation. No free air or free fluid in the abdomen.  Pelvis: Prostate gland is not enlarged. Bladder wall is not thickened. Appendix is normal. No pelvic mass or lymphadenopathy. No destructive bone lesions.  IMPRESSION: Diverticulitis in the mid descending colon with focal pericolonic gas consistent with micro perforation and diffuse inflammatory infiltration. No specific abscess.   Electronically Signed   By: Burman NievesWilliam  Stevens M.D.   On: 04/23/2014 23:24    Anti-infectives: Anti-infectives  Start     Dose/Rate Route Frequency Ordered Stop   04/24/14 1000  ciprofloxacin (CIPRO) IVPB 400 mg  Status:  Discontinued     400 mg 200 mL/hr over 60 Minutes Intravenous Every 12 hours 04/24/14 0106 04/24/14 0147   04/24/14 0900  metroNIDAZOLE (FLAGYL) IVPB 500 mg  Status:  Discontinued     500 mg 100 mL/hr over 60 Minutes Intravenous Every 8 hours 04/24/14 0108 04/24/14 0111   04/24/14 0600  metroNIDAZOLE (FLAGYL) IVPB 500 mg  Status:  Discontinued     500 mg 100 mL/hr over 60 Minutes  Intravenous Every 6 hours 04/24/14 0059 04/24/14 0155   04/24/14 0230  piperacillin-tazobactam (ZOSYN) IVPB 3.375 g     3.375 g 12.5 mL/hr over 240 Minutes Intravenous 3 times per day 04/24/14 0222     04/24/14 0200  cefTRIAXone (ROCEPHIN) 2 g in dextrose 5 % 50 mL IVPB - Premix  Status:  Discontinued    Comments:  Pharmacy may adjust dosing strength / duration / interval for maximal efficacy   2 g 100 mL/hr over 30 Minutes Intravenous Daily at bedtime 04/24/14 0147 04/24/14 0155   04/24/14 0100  cefTRIAXone (ROCEPHIN) 2 g in dextrose 5 % 50 mL IVPB  Status:  Discontinued    Comments:  Pharmacy may adjust dosing strength / duration / interval for maximal efficacy   2 g 100 mL/hr over 30 Minutes Intravenous Every 24 hours 04/24/14 0059 04/24/14 0106   04/24/14 0000  ciprofloxacin (CIPRO) IVPB 400 mg     400 mg 200 mL/hr over 60 Minutes Intravenous  Once 04/23/14 2346 04/24/14 0059   04/24/14 0000  metroNIDAZOLE (FLAGYL) IVPB 500 mg     500 mg 100 mL/hr over 60 Minutes Intravenous  Once 04/23/14 2346 04/24/14 0059       Assessment/Plan  1. Diverticulitis with microperforation -advance to clear liquids today since he is so much better -WBC has normalized -cont Zosyn D2/?14 2. ARI -per medicine   LOS: 0 days    Dallis Darden E 04/24/2014, 9:43 AM Pager: [phone]

## 2014-04-24 NOTE — Progress Notes (Signed)
ANTIBIOTIC CONSULT NOTE - INITIAL  Pharmacy Consult for zosyn Indication: Intra-abdominal infection  Allergies  Allergen Reactions  . Shellfish Allergy     Tongue and throat swelling.    Patient Measurements: Height: 5\' 6"  (167.6 cm) Weight: 245 lb 1.6 oz (111.177 kg) IBW/kg (Calculated) : 63.8 Adjusted Body Weight:   Vital Signs: Temp: 98.3 F (36.8 C) (03/09 0202) Temp Source: Oral (03/09 0202) BP: 140/100 mmHg (03/09 0202) Pulse Rate: 105 (03/09 0202) Intake/Output from previous day: 03/08 0701 - 03/09 0700 In: -  Out: 300 [Urine:300] Intake/Output from this shift: Total I/O In: -  Out: 300 [Urine:300]  Labs:  Recent Labs  04/23/14 1931  WBC 14.9*  HGB 14.9  PLT 282  CREATININE 1.65*   Estimated Creatinine Clearance: 73.2 mL/min (by C-G formula based on Cr of 1.65). No results for input(s): VANCOTROUGH, VANCOPEAK, VANCORANDOM, GENTTROUGH, GENTPEAK, GENTRANDOM, TOBRATROUGH, TOBRAPEAK, TOBRARND, AMIKACINPEAK, AMIKACINTROU, AMIKACIN in the last 72 hours.   Microbiology: No results found for this or any previous visit (from the past 720 hour(s)).  Medical History: Past Medical History  Diagnosis Date  . Diabetes mellitus without complication     Medications:  Anti-infectives    Start     Dose/Rate Route Frequency Ordered Stop   04/24/14 1000  ciprofloxacin (CIPRO) IVPB 400 mg  Status:  Discontinued     400 mg 200 mL/hr over 60 Minutes Intravenous Every 12 hours 04/24/14 0106 04/24/14 0147   04/24/14 0900  metroNIDAZOLE (FLAGYL) IVPB 500 mg  Status:  Discontinued     500 mg 100 mL/hr over 60 Minutes Intravenous Every 8 hours 04/24/14 0108 04/24/14 0111   04/24/14 0600  metroNIDAZOLE (FLAGYL) IVPB 500 mg  Status:  Discontinued     500 mg 100 mL/hr over 60 Minutes Intravenous Every 6 hours 04/24/14 0059 04/24/14 0155   04/24/14 0230  piperacillin-tazobactam (ZOSYN) IVPB 3.375 g     3.375 g 12.5 mL/hr over 240 Minutes Intravenous 3 times per day 04/24/14  0222     04/24/14 0200  cefTRIAXone (ROCEPHIN) 2 g in dextrose 5 % 50 mL IVPB - Premix  Status:  Discontinued    Comments:  Pharmacy may adjust dosing strength / duration / interval for maximal efficacy   2 g 100 mL/hr over 30 Minutes Intravenous Daily at bedtime 04/24/14 0147 04/24/14 0155   04/24/14 0100  cefTRIAXone (ROCEPHIN) 2 g in dextrose 5 % 50 mL IVPB  Status:  Discontinued    Comments:  Pharmacy may adjust dosing strength / duration / interval for maximal efficacy   2 g 100 mL/hr over 30 Minutes Intravenous Every 24 hours 04/24/14 0059 04/24/14 0106   04/24/14 0000  ciprofloxacin (CIPRO) IVPB 400 mg     400 mg 200 mL/hr over 60 Minutes Intravenous  Once 04/23/14 2346 04/24/14 0059   04/24/14 0000  metroNIDAZOLE (FLAGYL) IVPB 500 mg     500 mg 100 mL/hr over 60 Minutes Intravenous  Once 04/23/14 2346 04/24/14 0059     Assessment: Patient with Diverticulitis of desecnding colon with microperforation/Diverticulosis of left colon  Goal of Therapy:  Zosyn based on renal function   Plan:  Follow up culture results  Zosyn 3.375g IV Q8H infused over 4hrs.   Darlina GuysGrimsley Jr, Jacquenette ShoneJulian Crowford 04/24/2014,5:42 AM
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
## 2014-04-24 NOTE — Care Management Note (Signed)
    Page 1 of 1   04/24/2014     10:53:15 AM CARE MANAGEMENT NOTE 04/24/2014  Patient:  Alan Williams,Alan Williams   Account Number:  [id number]  Date Initiated:  04/24/2014  Documentation initiated by:  Lorenda IshiharaPEELE,Natallie Ravenscroft  Subjective/Objective Assessment:   34 yo male admitted with diverticulitis. PTA lived at home     Action/Plan:   Home when stable   Anticipated DC Date:  04/27/2014   Anticipated DC Plan:  HOME/SELF CARE      DC Planning Services  CM consult      Choice offered to / List presented to:             Status of service:  Completed, signed off Medicare Important Message given?   (If response is "NO", the following Medicare IM given date fields will be blank) Date Medicare IM given:   Medicare IM given by:   Date Additional Medicare IM given:   Additional Medicare IM given by:    Discharge Disposition:  HOME/SELF CARE  Per UR Regulation:  Reviewed for med. necessity/level of care/duration of stay  If discussed at Long Length of Stay Meetings, dates discussed:    Comments:

## 2014-04-25 LAB — CBC
HCT: 39.9 % (ref 39.0–52.0)
Hemoglobin: 12.7 g/dL — ABNORMAL LOW (ref 13.0–17.0)
MCH: 26.1 pg (ref 26.0–34.0)
MCHC: 31.8 g/dL (ref 30.0–36.0)
MCV: 82.1 fL (ref 78.0–100.0)
Platelets: 319 10*3/uL (ref 150–400)
RBC: 4.86 MIL/uL (ref 4.22–5.81)
RDW: 13.6 % (ref 11.5–15.5)
WBC: 5.3 10*3/uL (ref 4.0–10.5)

## 2014-04-25 LAB — BASIC METABOLIC PANEL
Anion gap: 9 (ref 5–15)
BUN: 10 mg/dL (ref 6–23)
CHLORIDE: 102 mmol/L (ref 96–112)
CO2: 27 mmol/L (ref 19–32)
Calcium: 8.7 mg/dL (ref 8.4–10.5)
Creatinine, Ser: 1.25 mg/dL (ref 0.50–1.35)
GFR calc Af Amer: 85 mL/min — ABNORMAL LOW (ref >90)
GFR calc non Af Amer: 73 mL/min — ABNORMAL LOW (ref >90)
GLUCOSE: 117 mg/dL — AB (ref 70–99)
Potassium: 4.1 mmol/L (ref 3.5–5.1)
SODIUM: 138 mmol/L (ref 135–145)

## 2014-04-25 LAB — GLUCOSE, CAPILLARY
Glucose-Capillary: 101 mg/dL — ABNORMAL HIGH (ref 70–99)
Glucose-Capillary: 107 mg/dL — ABNORMAL HIGH (ref 70–99)
Glucose-Capillary: 130 mg/dL — ABNORMAL HIGH (ref 70–99)
Glucose-Capillary: 156 mg/dL — ABNORMAL HIGH (ref 70–99)

## 2014-04-25 MED ORDER — METRONIDAZOLE 500 MG PO TABS: 500.0000 mg | ORAL_TABLET | Freq: Three times a day (TID) | ORAL | Status: DC

## 2014-04-25 MED ORDER — OXYCODONE HCL 5 MG PO TABS: 5.0000 mg | ORAL_TABLET | ORAL | Status: DC | PRN

## 2014-04-25 MED ORDER — SACCHAROMYCES BOULARDII 250 MG PO CAPS: 250.0000 mg | ORAL_CAPSULE | Freq: Two times a day (BID) | ORAL | Status: DC

## 2014-04-25 MED ORDER — CIPROFLOXACIN HCL 500 MG PO TABS: 500.0000 mg | ORAL_TABLET | Freq: Two times a day (BID) | ORAL | Status: DC

## 2014-04-25 NOTE — Progress Notes (Signed)
Patient ID: Alan Williams, male   DOB: [birth year], 34 y.o.   MRN: [id number]    Subjective: Pt feels much better today.  No further pain.  Tolerating clear liquids well.  Objective: Vital signs in last 24 hours: Temp:  [97.6 F (36.4 C)-98.6 F (37 C)] 98.6 F (37 C) (03/10 0603) Pulse Rate:  [91-100] 91 (03/10 0603) Resp:  [17-18] 17 (03/10 0603) BP: (119-150)/(75-96) 121/75 mmHg (03/10 0603) SpO2:  [97 %-99 %] 98 % (03/10 0603) Last BM Date: 04/23/14  Intake/Output from previous day: 03/09 0701 - 03/10 0700 In: 2899.2 [P.O.:700; I.V.:2199.2] Out: 2225 [Urine:2225] Intake/Output this shift:    PE: Abd: soft, NT, ND, +BS  Lab Results:   Recent Labs  04/24/14 0833 04/25/14 0555  WBC 8.7 5.3  HGB 13.7 12.7*  HCT 42.2 39.9  PLT 262 319   BMET  Recent Labs  04/24/14 0833 04/25/14 0555  NA 133* 138  K 3.8 4.1  CL 98 102  CO2 25 27  GLUCOSE 155* 117*  BUN 16 10  CREATININE 1.67* 1.25  CALCIUM 8.6 8.7   PT/INR No results for input(s): LABPROT, INR in the last 72 hours. CMP     Component Value Date/Time   NA 138 04/25/2014 0555   K 4.1 04/25/2014 0555   CL 102 04/25/2014 0555   CO2 27 04/25/2014 0555   GLUCOSE 117* 04/25/2014 0555   BUN 10 04/25/2014 0555   CREATININE 1.25 04/25/2014 0555   CALCIUM 8.7 04/25/2014 0555   PROT 8.6* 04/23/2014 1931   ALBUMIN 4.4 04/23/2014 1931   AST 28 04/23/2014 1931   ALT 27 04/23/2014 1931   ALKPHOS 69 04/23/2014 1931   BILITOT 0.8 04/23/2014 1931   GFRNONAA 73* 04/25/2014 0555   GFRAA 85* 04/25/2014 0555   Lipase     Component Value Date/Time   LIPASE 19 04/23/2014 1931       Studies/Results: Ct Abdomen Pelvis W Contrast  04/23/2014   CLINICAL DATA:  Abdominal pain starting on Sunday while at work. Pain radiates to the back. Nausea. No injury.  EXAM: CT ABDOMEN AND PELVIS WITH CONTRAST  TECHNIQUE: Multidetector CT imaging of the abdomen and pelvis was performed using the standard protocol following bolus  administration of intravenous contrast.  CONTRAST:  100mL OMNIPAQUE IOHEXOL 300 MG/ML SOLN, 50mL OMNIPAQUE IOHEXOL 300 MG/ML SOLN  COMPARISON:  None.  FINDINGS: Lung bases are clear. Sub cm low-attenuation lesion in the dome of the liver is additional characterize but likely to represent cyst or hemangioma. No other focal liver lesions identified. The liver, spleen, gallbladder, pancreas, adrenal glands, kidneys, abdominal aorta, inferior vena cava, and retroperitoneal lymph nodes are unremarkable. The stomach, small bowel, and colon are decompressed. There is diverticulosis of the descending colon with focal extraluminal gas adjacent to the mid descending colon and associated inflammatory infiltration along the pericolonic fat and iliopsoas region. This is consistent with acute diverticulitis with microperforation. No free air or free fluid in the abdomen.  Pelvis: Prostate gland is not enlarged. Bladder wall is not thickened. Appendix is normal. No pelvic mass or lymphadenopathy. No destructive bone lesions.  IMPRESSION: Diverticulitis in the mid descending colon with focal pericolonic gas consistent with micro perforation and diffuse inflammatory infiltration. No specific abscess.   Electronically Signed   By: Burman NievesWilliam  Stevens M.D.   On: 04/23/2014 23:24    Anti-infectives: Anti-infectives    Start     Dose/Rate Route Frequency Ordered Stop   04/25/14 0000  ciprofloxacin (CIPRO) 500  MG tablet     500 mg Oral 2 times daily 04/25/14 0837     04/25/14 0000  metroNIDAZOLE (FLAGYL) 500 MG tablet     500 mg Oral 3 times daily 04/25/14 0837     04/24/14 1000  ciprofloxacin (CIPRO) IVPB 400 mg  Status:  Discontinued     400 mg 200 mL/hr over 60 Minutes Intravenous Every 12 hours 04/24/14 0106 04/24/14 0147   04/24/14 0900  metroNIDAZOLE (FLAGYL) IVPB 500 mg  Status:  Discontinued     500 mg 100 mL/hr over 60 Minutes Intravenous Every 8 hours 04/24/14 0108 04/24/14 0111   04/24/14 0600  metroNIDAZOLE  (FLAGYL) IVPB 500 mg  Status:  Discontinued     500 mg 100 mL/hr over 60 Minutes Intravenous Every 6 hours 04/24/14 0059 04/24/14 0155   04/24/14 0230  piperacillin-tazobactam (ZOSYN) IVPB 3.375 g     3.375 g 12.5 mL/hr over 240 Minutes Intravenous 3 times per day 04/24/14 0222     04/24/14 0200  cefTRIAXone (ROCEPHIN) 2 g in dextrose 5 % 50 mL IVPB - Premix  Status:  Discontinued    Comments:  Pharmacy may adjust dosing strength / duration / interval for maximal efficacy   2 g 100 mL/hr over 30 Minutes Intravenous Daily at bedtime 04/24/14 0147 04/24/14 0155   04/24/14 0100  cefTRIAXone (ROCEPHIN) 2 g in dextrose 5 % 50 mL IVPB  Status:  Discontinued    Comments:  Pharmacy may adjust dosing strength / duration / interval for maximal efficacy   2 g 100 mL/hr over 30 Minutes Intravenous Every 24 hours 04/24/14 0059 04/24/14 0106   04/24/14 0000  ciprofloxacin (CIPRO) IVPB 400 mg     400 mg 200 mL/hr over 60 Minutes Intravenous  Once 04/23/14 2346 04/24/14 0059   04/24/14 0000  metroNIDAZOLE (FLAGYL) IVPB 500 mg     500 mg 100 mL/hr over 60 Minutes Intravenous  Once 04/23/14 2346 04/24/14 0059       Assessment/Plan  1. Diverticulitis with microperforation -advance to low fiber diet -if tolerates his diet, he is stable for dc home  -agree with 10-14 days of abx therapy -follow up with PCP -will need outpatient colonoscopy in 6-8 weeks -d/w patient smoking cessation   LOS: 1 day    Josephene Marrone E 04/25/2014, 9:47 AM Pager: [phone]

## 2014-04-25 NOTE — Progress Notes (Signed)
Nurse reviewed discharge instructions with pt.  Pt verbalized understanding of discharge instructions, follow up appointments and new medications.  No concerns at time of discharge.  Pt given prescriptions prior to discharge. 

## 2014-04-25 NOTE — Discharge Instructions (Signed)
Smoking Cessation Quitting smoking is important to your health and has many advantages. However, it is not always easy to quit since nicotine is a very addictive drug. Oftentimes, people try 3 times or more before being able to quit. This document explains the best ways for you to prepare to quit smoking. Quitting takes hard work and a lot of effort, but you can do it. ADVANTAGES OF QUITTING SMOKING  You will live longer, feel better, and live better.  Your body will feel the impact of quitting smoking almost immediately.  Within 20 minutes, blood pressure decreases. Your pulse returns to its normal level.  After 8 hours, carbon monoxide levels in the blood return to normal. Your oxygen level increases.  After 24 hours, the chance of having a heart attack starts to decrease. Your breath, hair, and body stop smelling like smoke.  After 48 hours, damaged nerve endings begin to recover. Your sense of taste and smell improve.  After 72 hours, the body is virtually free of nicotine. Your bronchial tubes relax and breathing becomes easier.  After 2 to 12 weeks, lungs can hold more air. Exercise becomes easier and circulation improves.  The risk of having a heart attack, stroke, cancer, or lung disease is greatly reduced.  After 1 year, the risk of coronary heart disease is cut in half.  After 5 years, the risk of stroke falls to the same as a nonsmoker.  After 10 years, the risk of lung cancer is cut in half and the risk of other cancers decreases significantly.  After 15 years, the risk of coronary heart disease drops, usually to the level of a nonsmoker.  If you are pregnant, quitting smoking will improve your chances of having a healthy baby.  The people you live with, especially any children, will be healthier.  You will have extra money to spend on things other than cigarettes. QUESTIONS TO THINK ABOUT BEFORE ATTEMPTING TO QUIT You may want to talk about your answers with your  health care provider.  Why do you want to quit?  If you tried to quit in the past, what helped and what did not?  What will be the most difficult situations for you after you quit? How will you plan to handle them?  Who can help you through the tough times? Your family? Friends? A health care provider?  What pleasures do you get from smoking? What ways can you still get pleasure if you quit? Here are some questions to ask your health care provider:  How can you help me to be successful at quitting?  What medicine do you think would be best for me and how should I take it?  What should I do if I need more help?  What is smoking withdrawal like? How can I get information on withdrawal? GET READY  Set a quit date.  Change your environment by getting rid of all cigarettes, ashtrays, matches, and lighters in your home, car, or work. Do not let people smoke in your home.  Review your past attempts to quit. Think about what worked and what did not. GET SUPPORT AND ENCOURAGEMENT You have a better chance of being successful if you have help. You can get support in many ways.  Tell your family, friends, and coworkers that you are going to quit and need their support. Ask them not to smoke around you.  Get individual, group, or telephone counseling and support. Programs are available at [employer] and health centers. Call  your local health department for information about programs in your area.  Spiritual beliefs and practices may help some smokers quit.  Download a "quit meter" on your computer to keep track of quit statistics, such as how long you have gone without smoking, cigarettes not smoked, and money saved.  Get a self-help book about quitting smoking and staying off tobacco. LEARN NEW SKILLS AND BEHAVIORS  Distract yourself from urges to smoke. Talk to someone, go for a walk, or occupy your time with a task.  Change your normal routine. Take a different route to work.  Drink tea instead of coffee. Eat breakfast in a different place.  Reduce your stress. Take a hot bath, exercise, or read a book.  Plan something enjoyable to do every day. Reward yourself for not smoking.  Explore interactive web-based programs that specialize in helping you quit. GET MEDICINE AND USE IT CORRECTLY Medicines can help you stop smoking and decrease the urge to smoke. Combining medicine with the above behavioral methods and support can greatly increase your chances of successfully quitting smoking.  Nicotine replacement therapy helps deliver nicotine to your body without the negative effects and risks of smoking. Nicotine replacement therapy includes nicotine gum, lozenges, inhalers, nasal sprays, and skin patches. Some may be available over-the-counter and others require a prescription.  Antidepressant medicine helps people abstain from smoking, but how this works is unknown. This medicine is available by prescription.  Nicotinic receptor partial agonist medicine simulates the effect of nicotine in your brain. This medicine is available by prescription. Ask your health care provider for advice about which medicines to use and how to use them based on your health history. Your health care provider will tell you what side effects to look out for if you choose to be on a medicine or therapy. Carefully read the information on the package. Do not use any other product containing nicotine while using a nicotine replacement product.  RELAPSE OR DIFFICULT SITUATIONS Most relapses occur within the first 3 months after quitting. Do not be discouraged if you start smoking again. Remember, most people try several times before finally quitting. You may have symptoms of withdrawal because your body is used to nicotine. You may crave cigarettes, be irritable, feel very hungry, cough often, get headaches, or have difficulty concentrating. The withdrawal symptoms are only temporary. They are strongest  when you first quit, but they will go away within 10-14 days. To reduce the chances of relapse, try to:  Avoid drinking alcohol. Drinking lowers your chances of successfully quitting.  Reduce the amount of caffeine you consume. Once you quit smoking, the amount of caffeine in your body increases and can give you symptoms, such as a rapid heartbeat, sweating, and anxiety.  Avoid smokers because they can make you want to smoke.  Do not let weight gain distract you. Many smokers will gain weight when they quit, usually less than 10 pounds. Eat a healthy diet and stay active. You can always lose the weight gained after you quit.  Find ways to improve your mood other than smoking. FOR MORE INFORMATION  www.smokefree.gov  Document Released: 01/26/2001 Document Revised: 06/18/2013 Document Reviewed: 05/13/2011 [facility] Patient Information 2015 [place], [place]. This information is not intended to replace advice given to you by your health care provider. Make sure you discuss any questions you have with your health care provider.  Diverticulitis Diverticulitis is inflammation or infection of small pouches in your colon that form when you have a condition called diverticulosis.  The pouches in your colon are called diverticula. Your colon, or large intestine, is where water is absorbed and stool is formed. Complications of diverticulitis can include:  Bleeding.  Severe infection.  Severe pain.  Perforation of your colon.  Obstruction of your colon. CAUSES  Diverticulitis is caused by bacteria. Diverticulitis happens when stool becomes trapped in diverticula. This allows bacteria to grow in the diverticula, which can lead to inflammation and infection. RISK FACTORS People with diverticulosis are at risk for diverticulitis. Eating a diet that does not include enough fiber from fruits and vegetables may make diverticulitis more likely to develop. SYMPTOMS  Symptoms of diverticulitis may  include:  Abdominal pain and tenderness. The pain is normally located on the left side of the abdomen, but may occur in other areas.  Fever and chills.  Bloating.  Cramping.  Nausea.  Vomiting.  Constipation.  Diarrhea.  Blood in your stool. DIAGNOSIS  Your health care provider will ask you about your medical history and do a physical exam. You may need to have tests done because many medical conditions can cause the same symptoms as diverticulitis. Tests may include:  Blood tests.  Urine tests.  Imaging tests of the abdomen, including X-rays and CT scans. When your condition is under control, your health care provider may recommend that you have a colonoscopy. A colonoscopy can show how severe your diverticula are and whether something else is causing your symptoms. TREATMENT  Most cases of diverticulitis are mild and can be treated at home. Treatment may include:  Taking over-the-counter pain medicines.  Following a clear liquid diet.  Taking antibiotic medicines by mouth for 7-10 days. More severe cases may be treated at a hospital. Treatment may include:  Not eating or drinking.  Taking prescription pain medicine.  Receiving antibiotic medicines through an IV tube.  Receiving fluids and nutrition through an IV tube.  Surgery. HOME CARE INSTRUCTIONS   Follow your health care provider's instructions carefully.  Follow a full liquid diet or other diet as directed by your health care provider. After your symptoms improve, your health care provider may tell you to change your diet. He or she may recommend you eat a high-fiber diet. Fruits and vegetables are good sources of fiber. Fiber makes it easier to pass stool.  Take fiber supplements or probiotics as directed by your health care provider.  Only take medicines as directed by your health care provider.  Keep all your follow-up appointments. SEEK MEDICAL CARE IF:   Your pain does not improve.  You have  a hard time eating food.  Your bowel movements do not return to normal. SEEK IMMEDIATE MEDICAL CARE IF:   Your pain becomes worse.  Your symptoms do not get better.  Your symptoms suddenly get worse.  You have a fever.  You have repeated vomiting.  You have bloody or black, tarry stools. MAKE SURE YOU:   Understand these instructions.  Will watch your condition.  Will get help right away if you are not doing well or get worse. Document Released: 11/11/2004 Document Revised: 02/06/2013 Document Reviewed: 12/27/2012 [facility] Patient Information 2015 [place], [place]. This information is not intended to replace advice given to you by your health care provider. Make sure you discuss any questions you have with your health care provider.  Low-Fiber Diet x 2 weeks, then switch to high fiber diet Fiber is found in fruits, vegetables, and whole grains. A low-fiber diet restricts fibrous foods that are not digested in the small  intestine. A diet containing about 10-15 grams of fiber per day is considered low fiber. Low-fiber diets may be used to:  Promote healing and rest the bowel during intestinal flare-ups.  Prevent blockage of a partially obstructed or narrowed gastrointestinal tract.  Reduce fecal weight and volume.  Slow the movement of feces. You may be on a low-fiber diet as a transitional diet following surgery, after an injury (trauma), or because of a short (acute) or lifelong (chronic) illness. Your health care provider will determine the length of time you need to stay on this diet.  WHAT DO I NEED TO KNOW ABOUT A LOW-FIBER DIET? Always check the fiber content on the packaging's Nutrition Facts label, especially on foods from the grains list. Ask your dietitian if you have questions about specific foods that are related to your condition, especially if the food is not listed below. In general, a low-fiber food will have less than 2 g of fiber. WHAT FOODS CAN I  EAT? Grains All breads and crackers made with white flour. Sweet rolls, doughnuts, waffles, pancakes, [place] toast, bagels. Pretzels, Melba toast, zwieback. Well-cooked cereals, such as cornmeal, farina, or cream cereals. Dry cereals that do not contain whole grains, fruit, or nuts, such as refined corn, wheat, rice, and oat cereals. Potatoes prepared any way without skins, plain pastas and noodles, refined white rice. Use white flour for baking and making sauces. Use allowed list of grains for casseroles, dumplings, and puddings.  Vegetables Strained tomato and vegetable juices. Fresh lettuce, cucumber, spinach. Well-cooked (no skin or pulp) or canned vegetables, such as asparagus, bean sprouts, beets, carrots, green beans, mushrooms, potatoes, pumpkin, spinach, yellow squash, tomato sauce/puree, turnips, yams, and zucchini. Keep servings limited to  cup.  Fruits All fruit juices except prune juice. Cooked or canned fruits without skin and seeds, such as applesauce, apricots, cherries, fruit cocktail, grapefruit, grapes, mandarin oranges, melons, peaches, pears, pineapple, and plums. Fresh fruits without skin, such as apricots, avocados, bananas, melons, pineapple, nectarines, and peaches. Keep servings limited to  cup or 1 piece.  Meat and Other Protein Sources Ground or well-cooked tender beef, ham, veal, lamb, pork, or poultry. Eggs, plain cheese. Fish, oysters, shrimp, lobster, and other seafood. Liver, organ meats. Smooth nut butters. Dairy All milk products and alternative dairy substitutes, such as soy, rice, almond, and coconut, not containing added whole nuts, seeds, or added fruit. Beverages Decaf coffee, fruit, and vegetable juices or smoothies (small amounts, with no pulp or skins, and with fruits from allowed list), sports drinks, herbal tea. Condiments Ketchup, mustard, vinegar, cream sauce, cheese sauce, cocoa powder. Spices in moderation, such as allspice, basil, bay leaves, celery  powder or leaves, cinnamon, cumin powder, curry powder, ginger, mace, marjoram, onion or garlic powder, oregano, paprika, parsley flakes, ground pepper, rosemary, sage, savory, tarragon, thyme, and turmeric. Sweets and Desserts Plain cakes and cookies, pie made with allowed fruit, pudding, custard, cream pie. Gelatin, fruit, ice, sherbet, frozen ice pops. Ice cream, ice milk without nuts. Plain hard candy, honey, jelly, molasses, syrup, sugar, chocolate syrup, gumdrops, marshmallows. Limit overall sugar intake.  Fats and Oil Margarine, butter, cream, mayonnaise, salad oils, plain salad dressings made from allowed foods. Choose healthy fats such as olive oil, canola oil, and omega-3 fatty acids (such as found in salmon or tuna) when possible.  Other Bouillon, broth, or cream soups made from allowed foods. Any strained soup. Casseroles or mixed dishes made with allowed foods. The items listed above may not be a complete  list of recommended foods or beverages. Contact your dietitian for more options.  WHAT FOODS ARE NOT RECOMMENDED? Grains All whole wheat and whole grain breads and crackers. Multigrains, rye, bran seeds, nuts, or coconut. Cereals containing whole grains, multigrains, bran, coconut, nuts, raisins. Cooked or dry oatmeal, steel-cut oats. Coarse wheat cereals, granola. Cereals advertised as high fiber. Potato skins. Whole grain pasta, wild or brown rice. Popcorn. Coconut flour. Bran, buckwheat, corn bread, multigrains, rye, wheat germ.  Vegetables Fresh, cooked or canned vegetables, such as artichokes, asparagus, beet greens, broccoli, Brussels sprouts, cabbage, celery, cauliflower, corn, eggplant, kale, legumes or beans, okra, peas, and tomatoes. Avoid large servings of any vegetables, especially raw vegetables.  Fruits Fresh fruits, such as apples with or without skin, berries, cherries, figs, grapes, grapefruit, guavas, kiwis, mangoes, oranges, papayas, pears, persimmons, pineapple, and  pomegranate. Prune juice and juices with pulp, stewed or dried prunes. Dried fruits, dates, raisins. Fruit seeds or skins. Avoid large servings of all fresh fruits. Meats and Other Protein Sources Tough, fibrous meats with gristle. Chunky nut butter. Cheese made with seeds, nuts, or other foods not recommended. Nuts, seeds, legumes (beans, including baked beans), dried peas, beans, lentils.  Dairy Yogurt or cheese that contains nuts, seeds, or added fruit.  Beverages Fruit juices with high pulp, prune juice. Caffeinated coffee and teas.  Condiments Coconut, maple syrup, pickles, olives. Sweets and Desserts Desserts, cookies, or candies that contain nuts or coconut, chunky peanut butter, dried fruits. Jams, preserves with seeds, marmalade. Large amounts of sugar and sweets. Any other dessert made with fruits from the not recommended list.  Other Soups made from vegetables that are not recommended or that contain other foods not recommended.  The items listed above may not be a complete list of foods and beverages to avoid. Contact your dietitian for more information. Document Released: 07/24/2001 Document Revised: 02/06/2013 Document Reviewed: 12/25/2012 [facility] Patient Information 2015 [place], [place]. This information is not intended to replace advice given to you by your health care provider. Make sure you discuss any questions you have with your health care provider.  High-Fiber Diet Fiber is found in fruits, vegetables, and grains. A high-fiber diet encourages the addition of more whole grains, legumes, fruits, and vegetables in your diet. The recommended amount of fiber for adult males is 38 g per day. For adult females, it is 25 g per day. Pregnant and lactating women should get 28 g of fiber per day. If you have a digestive or bowel problem, ask your caregiver for advice before adding high-fiber foods to your diet. Eat a variety of high-fiber foods instead of only a select few type of  foods.  PURPOSE  To increase stool bulk.  To make bowel movements more regular to prevent constipation.  To lower cholesterol.  To prevent overeating. WHEN IS THIS DIET USED?  It may be used if you have constipation and hemorrhoids.  It may be used if you have uncomplicated diverticulosis (intestine condition) and irritable bowel syndrome.  It may be used if you need help with weight management.  It may be used if you want to add it to your diet as a protective measure against atherosclerosis, diabetes, and cancer. SOURCES OF FIBER  Whole-grain breads and cereals.  Fruits, such as apples, oranges, bananas, berries, prunes, and pears.  Vegetables, such as green peas, carrots, sweet potatoes, beets, broccoli, cabbage, spinach, and artichokes.  Legumes, such split peas, soy, lentils.  Almonds. FIBER CONTENT IN FOODS Starches and Grains / Dietary  Fiber (g)  Cheerios, 1 cup / 3 g  Corn Flakes cereal, 1 cup / 0.7 g  Rice crispy treat cereal, 1 cup / 0.3 g  Instant oatmeal (cooked),  cup / 2 g  Frosted wheat cereal, 1 cup / 5.1 g  Brown, long-grain rice (cooked), 1 cup / 3.5 g  White, long-grain rice (cooked), 1 cup / 0.6 g  Enriched macaroni (cooked), 1 cup / 2.5 g Legumes / Dietary Fiber (g)  Baked beans (canned, plain, or vegetarian),  cup / 5.2 g  Kidney beans (canned),  cup / 6.8 g  Pinto beans (cooked),  cup / 5.5 g Breads and Crackers / Dietary Fiber (g)  Plain or honey graham crackers, 2 squares / 0.7 g  Saltine crackers, 3 squares / 0.3 g  Plain, salted pretzels, 10 pieces / 1.8 g  Whole-wheat bread, 1 slice / 1.9 g  White bread, 1 slice / 0.7 g  Raisin bread, 1 slice / 1.2 g  Plain bagel, 3 oz / 2 g  Flour tortilla, 1 oz / 0.9 g  Corn tortilla, 1 small / 1.5 g  Hamburger or hotdog bun, 1 small / 0.9 g Fruits / Dietary Fiber (g)  Apple with skin, 1 medium / 4.4 g  Sweetened applesauce,  cup / 1.5 g  Banana,  medium / 1.5  g  Grapes, 10 grapes / 0.4 g  Orange, 1 small / 2.3 g  Raisin, 1.5 oz / 1.6 g  Melon, 1 cup / 1.4 g Vegetables / Dietary Fiber (g)  Green beans (canned),  cup / 1.3 g  Carrots (cooked),  cup / 2.3 g  Broccoli (cooked),  cup / 2.8 g  Peas (cooked),  cup / 4.4 g  Mashed potatoes,  cup / 1.6 g  Lettuce, 1 cup / 0.5 g  Corn (canned),  cup / 1.6 g  Tomato,  cup / 1.1 g Document Released: 02/01/2005 Document Revised: 08/03/2011 Document Reviewed: 05/06/2011 [employer] Patient Information 2015 [place], [place]. This information is not intended to replace advice given to you by your health care provider. Make sure you discuss any questions you have with your health care provider.

## 2014-04-25 NOTE — Discharge Summary (Signed)
PATIENT DETAILS Name: Alan Williams Age: 34 y.o. Sex: male Date of Birth: 05-Apr-1980 MRN: [id number]. Admitting Physician: Ron ParkerHarvette C Jenkins, MD IHK:[id number]:Adefolalu, Alyce PaganAnire, PA-C  Admit Date: 04/23/2014 Discharge date: 04/25/2014  Recommendations for Outpatient Follow-up:  1. Needs colonoscopy in 6-8 weeks 2. Ensure patient completes  course oral antibiotics 3. CBC and chemistries at next follow-up with PCP  PRIMARY DISCHARGE DIAGNOSIS:  Principal Problem:   Diverticulitis of descending colon with microperforation Active Problems:   Diverticulosis of left colon   Diabetes mellitus without complication   Leukocytosis   Obesity (BMI 30-39.9)   Heartburn      PAST MEDICAL HISTORY: Past Medical History  Diagnosis Date  . Diabetes mellitus without complication     DISCHARGE MEDICATIONS: Current Discharge Medication List    START taking these medications   Details  ciprofloxacin (CIPRO) 500 MG tablet Take 1 tablet (500 mg total) by mouth 2 (two) times daily. Qty: 20 tablet, Refills: 0    metroNIDAZOLE (FLAGYL) 500 MG tablet Take 1 tablet (500 mg total) by mouth 3 (three) times daily. Qty: 30 tablet, Refills: 0    oxyCODONE (OXY IR/ROXICODONE) 5 MG immediate release tablet Take 1 tablet (5 mg total) by mouth every 4 (four) hours as needed for moderate pain. Qty: 30 tablet, Refills: 0    saccharomyces boulardii (FLORASTOR) 250 MG capsule Take 1 capsule (250 mg total) by mouth 2 (two) times daily. Qty: 30 capsule, Refills: 0      CONTINUE these medications which have NOT CHANGED   Details  Glimepiride (AMARYL PO) Take 1 tablet by mouth daily.      STOP taking these medications     ibuprofen (ADVIL,MOTRIN) 200 MG tablet      Meloxicam (MOBIC PO)      METFORMIN HCL PO         ALLERGIES:   Allergies  Allergen Reactions  . Shellfish Allergy     Tongue and throat swelling.    BRIEF HPI:  See H&P, Labs, Consult and Test reports for all details in brief, patient is  a 34 year old male with history of type 2 diabetes who presented with lower abdominal pain. He was found to have acute diverticulitis with microperforation on his CT scan of the abdomen and hence admitted to the hospitalist service.  CONSULTATIONS:   general surgery  PERTINENT RADIOLOGIC STUDIES: Ct Abdomen Pelvis W Contrast  04/23/2014   CLINICAL DATA:  Abdominal pain starting on Sunday while at work. Pain radiates to the back. Nausea. No injury.  EXAM: CT ABDOMEN AND PELVIS WITH CONTRAST  TECHNIQUE: Multidetector CT imaging of the abdomen and pelvis was performed using the standard protocol following bolus administration of intravenous contrast.  CONTRAST:  100mL OMNIPAQUE IOHEXOL 300 MG/ML SOLN, 50mL OMNIPAQUE IOHEXOL 300 MG/ML SOLN  COMPARISON:  None.  FINDINGS: Lung bases are clear. Sub cm low-attenuation lesion in the dome of the liver is additional characterize but likely to represent cyst or hemangioma. No other focal liver lesions identified. The liver, spleen, gallbladder, pancreas, adrenal glands, kidneys, abdominal aorta, inferior vena cava, and retroperitoneal lymph nodes are unremarkable. The stomach, small bowel, and colon are decompressed. There is diverticulosis of the descending colon with focal extraluminal gas adjacent to the mid descending colon and associated inflammatory infiltration along the pericolonic fat and iliopsoas region. This is consistent with acute diverticulitis with microperforation. No free air or free fluid in the abdomen.  Pelvis: Prostate gland is not enlarged. Bladder wall is not thickened. Appendix is normal.  No pelvic mass or lymphadenopathy. No destructive bone lesions.  IMPRESSION: Diverticulitis in the mid descending colon with focal pericolonic gas consistent with micro perforation and diffuse inflammatory infiltration. No specific abscess.   Electronically Signed   By: Burman Nieves M.D.   On: 04/23/2014 23:24     PERTINENT LAB RESULTS: CBC:  Recent  Labs  04/24/14 0833 04/25/14 0555  WBC 8.7 5.3  HGB 13.7 12.7*  HCT 42.2 39.9  PLT 262 319   CMET CMP     Component Value Date/Time   NA 138 04/25/2014 0555   K 4.1 04/25/2014 0555   CL 102 04/25/2014 0555   CO2 27 04/25/2014 0555   GLUCOSE 117* 04/25/2014 0555   BUN 10 04/25/2014 0555   CREATININE 1.25 04/25/2014 0555   CALCIUM 8.7 04/25/2014 0555   PROT 8.6* 04/23/2014 1931   ALBUMIN 4.4 04/23/2014 1931   AST 28 04/23/2014 1931   ALT 27 04/23/2014 1931   ALKPHOS 69 04/23/2014 1931   BILITOT 0.8 04/23/2014 1931   GFRNONAA 73* 04/25/2014 0555   GFRAA 85* 04/25/2014 0555    GFR Estimated Creatinine Clearance: 96.6 mL/min (by C-G formula based on Cr of 1.25).  Recent Labs  04/23/14 1931  LIPASE 19   No results for input(s): CKTOTAL, CKMB, CKMBINDEX, TROPONINI in the last 72 hours. Invalid input(s): POCBNP No results for input(s): DDIMER in the last 72 hours. No results for input(s): HGBA1C in the last 72 hours. No results for input(s): CHOL, HDL, LDLCALC, TRIG, CHOLHDL, LDLDIRECT in the last 72 hours. No results for input(s): TSH, T4TOTAL, T3FREE, THYROIDAB in the last 72 hours.  Invalid input(s): FREET3 No results for input(s): VITAMINB12, FOLATE, FERRITIN, TIBC, IRON, RETICCTPCT in the last 72 hours. Coags: No results for input(s): INR in the last 72 hours.  Invalid input(s): PT Microbiology: No results found for this or any previous visit (from the past 240 hour(s)).   BRIEF HOSPITAL COURSE:  Diverticulitis of descending colon with microperforation: Admitted, and started on IV Zosyn. With supportive measures, patient clinically improved. Started on clear liquids and subsequently advanced to a low fiber diet. Patient doing well, tolerating diet easily with hardly any pain. Stable for discharge today, will transition to ciprofloxacin and Flagyl. Patient instructed to follow-up with his PCP for outpatient colonoscopy in 6-8 weeks. General surgery was consulted  during this patient's hospital stay.   Active Problems:  ? 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 Suspected prenatal azotemia secondary to diverticulitis. Renal function has normalized with hydration.   Diabetes mellitus without complication: Continue with SSI-CBGs stable while inpatient. Resume Amaryl on discharge   Obesity (BMI 30-39.9): Counseled   TODAY-DAY OF DISCHARGE:  Subjective:   Monterrio Gerst today has no headache,no chest abdominal pain,no new weakness tingling or numbness, feels much better wants to go home today.   Objective:   Blood pressure 121/75, pulse 91, temperature 98.6 F (37 C), temperature source Oral, resp. rate 17, height  (1.676 m), weight 111.177 kg (245 lb 1.6 oz), SpO2 98 %.  Intake/Output Summary (Last 24 hours) at 04/25/14 1326 Last data filed at 04/25/14 1000  Gross per 24 hour  Intake 3299.17 ml  Output   2225 ml  Net 1074.17 ml   Filed Weights   04/24/14 0109  Weight: 111.177 kg (245 lb 1.6 oz)    Exam Awake Alert, Oriented *3, No new F.N deficits, Normal affect [place].AT,PERRAL Supple Neck,No JVD, No  cervical lymphadenopathy appriciated.  Symmetrical Chest wall movement, Good air movement bilaterally, CTAB RRR,No Gallops,Rubs or new Murmurs, No Parasternal Heave +ve B.Sounds, Abd Soft, Non tender, No organomegaly appriciated, No rebound -guarding or rigidity. No Cyanosis, Clubbing or edema, No new Rash or bruise  DISCHARGE CONDITION: Stable  DISPOSITION: Home  DISCHARGE INSTRUCTIONS:    Activity:  As tolerated   Diet recommendation: Diabetic Diet-low fiber diet    Follow-up Information    Follow up with Adefolalu, Anire, PA-C. Schedule an appointment as soon as possible for a visit in 2 weeks.   Why:  post hospital follow up.  You will also need a colonoscopy in 6-8 weeks as well   Contact information:   [address] [place]  [address] [phone]       Follow up with [facility] [place]. Schedule an appointment as soon as possible for a visit in 2 weeks.   Contact information:   [address] [place] [address] [phone]      Total Time spent on discharge equals 45 minutes.  SignedJeoffrey Massed 04/25/2014 1:26 PM

## 2014-04-26 LAB — HEMOGLOBIN A1C
Hgb A1c MFr Bld: 7.1 % — ABNORMAL HIGH (ref 4.8–5.6)
Mean Plasma Glucose: 157 mg/dL

## 2014-08-29 ENCOUNTER — Encounter (HOSPITAL_COMMUNITY): Payer: Self-pay | Admitting: Emergency Medicine

## 2014-12-15 ENCOUNTER — Encounter (HOSPITAL_COMMUNITY): Payer: Self-pay

## 2014-12-15 ENCOUNTER — Emergency Department (HOSPITAL_COMMUNITY)
Admission: EM | Admit: 2014-12-15 | Discharge: 2014-12-15 | Disposition: A | Discharge status: Home / Self Care | Payer: Medicaid Other | Attending: Emergency Medicine | Admitting: Emergency Medicine

## 2014-12-15 DIAGNOSIS — H109 Unspecified conjunctivitis: Secondary | ICD-10-CM

## 2014-12-15 DIAGNOSIS — Z79899 Other long term (current) drug therapy: Secondary | ICD-10-CM | POA: Insufficient documentation

## 2014-12-15 DIAGNOSIS — E119 Type 2 diabetes mellitus without complications: Secondary | ICD-10-CM | POA: Insufficient documentation

## 2014-12-15 DIAGNOSIS — I10 Essential (primary) hypertension: Secondary | ICD-10-CM | POA: Insufficient documentation

## 2014-12-15 DIAGNOSIS — Z7984 Long term (current) use of oral hypoglycemic drugs: Secondary | ICD-10-CM | POA: Insufficient documentation

## 2014-12-15 DIAGNOSIS — H1089 Other conjunctivitis: Secondary | ICD-10-CM | POA: Insufficient documentation

## 2014-12-15 DIAGNOSIS — Z720 Tobacco use: Secondary | ICD-10-CM | POA: Insufficient documentation

## 2014-12-15 DIAGNOSIS — Z792 Long term (current) use of antibiotics: Secondary | ICD-10-CM | POA: Insufficient documentation

## 2014-12-15 DIAGNOSIS — Z7952 Long term (current) use of systemic steroids: Secondary | ICD-10-CM | POA: Insufficient documentation

## 2014-12-15 DIAGNOSIS — H11421 Conjunctival edema, right eye: Secondary | ICD-10-CM | POA: Insufficient documentation

## 2014-12-15 MED ORDER — FLUORESCEIN SODIUM 1 MG OP STRP
1.0000 | ORAL_STRIP | Freq: Once | OPHTHALMIC | Status: AC
  Administered 2014-12-15: 1 via OPHTHALMIC
  Filled 2014-12-15: qty 1

## 2014-12-15 MED ORDER — TETRACAINE HCL 0.5 % OP SOLN
1.0000 [drp] | Freq: Once | OPHTHALMIC | Status: AC
  Administered 2014-12-15: 1 [drp] via OPHTHALMIC
  Filled 2014-12-15: qty 2

## 2014-12-15 MED ORDER — TOBRAMYCIN 0.3 % OP SOLN: 2.0000 [drp] | OPHTHALMIC | Status: DC

## 2014-12-15 NOTE — ED Notes (Signed)
Pt discharged to home. Left unit in good condition ambulatory accompanied by male family member. Prescription x 1 given. No concerns voiced. Vwilliams, rn.

## 2014-12-15 NOTE — ED Notes (Signed)
Patient c/o right eye drainage, redness, and soreness this AM.
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
## 2014-12-15 NOTE — Discharge Instructions (Signed)
Apply to drops of Tobrex to your right eye every 4 hours. Refrain from touching your eye and continue washing your hands. Follow-up with your primary care provider in one week. Return to the emergency department if symptoms worsen or new onset of fever, headache, visual changes.

## 2015-01-19 ENCOUNTER — Encounter (HOSPITAL_COMMUNITY): Payer: Self-pay

## 2015-01-19 ENCOUNTER — Emergency Department (HOSPITAL_COMMUNITY)
Admission: EM | Admit: 2015-01-19 | Discharge: 2015-01-19 | Disposition: A | Discharge status: Home / Self Care | Payer: Medicaid Other | Attending: Emergency Medicine | Admitting: Emergency Medicine

## 2015-01-19 DIAGNOSIS — Z792 Long term (current) use of antibiotics: Secondary | ICD-10-CM | POA: Insufficient documentation

## 2015-01-19 DIAGNOSIS — E119 Type 2 diabetes mellitus without complications: Secondary | ICD-10-CM | POA: Insufficient documentation

## 2015-01-19 DIAGNOSIS — Z79899 Other long term (current) drug therapy: Secondary | ICD-10-CM | POA: Insufficient documentation

## 2015-01-19 DIAGNOSIS — F17210 Nicotine dependence, cigarettes, uncomplicated: Secondary | ICD-10-CM | POA: Insufficient documentation

## 2015-01-19 DIAGNOSIS — R000 Tachycardia, unspecified: Secondary | ICD-10-CM | POA: Insufficient documentation

## 2015-01-19 DIAGNOSIS — K5792 Diverticulitis of intestine, part unspecified, without perforation or abscess without bleeding: Secondary | ICD-10-CM | POA: Insufficient documentation

## 2015-01-19 DIAGNOSIS — Z7984 Long term (current) use of oral hypoglycemic drugs: Secondary | ICD-10-CM | POA: Insufficient documentation

## 2015-01-19 DIAGNOSIS — R1030 Lower abdominal pain, unspecified: Secondary | ICD-10-CM

## 2015-01-19 DIAGNOSIS — Z7952 Long term (current) use of systemic steroids: Secondary | ICD-10-CM | POA: Insufficient documentation

## 2015-01-19 DIAGNOSIS — I10 Essential (primary) hypertension: Secondary | ICD-10-CM | POA: Insufficient documentation

## 2015-01-19 DIAGNOSIS — K5900 Constipation, unspecified: Secondary | ICD-10-CM | POA: Insufficient documentation

## 2015-01-19 HISTORY — DX: Diverticulitis of intestine, part unspecified, without perforation or abscess without bleeding: K57.92

## 2015-01-19 LAB — URINALYSIS, ROUTINE W REFLEX MICROSCOPIC
Bilirubin Urine: NEGATIVE
Hgb urine dipstick: NEGATIVE
KETONES UR: 15 mg/dL — AB
LEUKOCYTES UA: NEGATIVE
Nitrite: NEGATIVE
PH: 6 (ref 5.0–8.0)
Protein, ur: NEGATIVE mg/dL
Specific Gravity, Urine: >1.046 — ABNORMAL HIGH (ref 1.005–1.030)

## 2015-01-19 LAB — COMPREHENSIVE METABOLIC PANEL
ALK PHOS: 63 U/L (ref 38–126)
ALT: 46 U/L (ref 17–63)
ANION GAP: 12 (ref 5–15)
AST: 38 U/L (ref 15–41)
Albumin: 4.6 g/dL (ref 3.5–5.0)
BILIRUBIN TOTAL: 1.5 mg/dL — AB (ref 0.3–1.2)
BUN: 11 mg/dL (ref 6–20)
CALCIUM: 10 mg/dL (ref 8.9–10.3)
CO2: 25 mmol/L (ref 22–32)
Chloride: 94 mmol/L — ABNORMAL LOW (ref 101–111)
Creatinine, Ser: 1.02 mg/dL (ref 0.61–1.24)
Glucose, Bld: 323 mg/dL — ABNORMAL HIGH (ref 65–99)
Potassium: 3.9 mmol/L (ref 3.5–5.1)
Sodium: 131 mmol/L — ABNORMAL LOW (ref 135–145)
TOTAL PROTEIN: 8 g/dL (ref 6.5–8.1)

## 2015-01-19 LAB — CBC
HCT: 45.1 % (ref 39.0–52.0)
HEMOGLOBIN: 15.4 g/dL (ref 13.0–17.0)
MCH: 26.7 pg (ref 26.0–34.0)
MCHC: 34.1 g/dL (ref 30.0–36.0)
MCV: 78.2 fL (ref 78.0–100.0)
Platelets: 256 10*3/uL (ref 150–400)
RBC: 5.77 MIL/uL (ref 4.22–5.81)
RDW: 12.8 % (ref 11.5–15.5)
WBC: 6.3 10*3/uL (ref 4.0–10.5)

## 2015-01-19 LAB — URINE MICROSCOPIC-ADD ON
BACTERIA UA: NONE SEEN
RBC / HPF: NONE SEEN RBC/hpf (ref 0–5)
Squamous Epithelial / LPF: NONE SEEN

## 2015-01-19 LAB — LIPASE, BLOOD: Lipase: 35 U/L (ref 11–51)

## 2015-01-19 MED ORDER — DICYCLOMINE HCL 20 MG PO TABS
20.0000 mg | ORAL_TABLET | Freq: Two times a day (BID) | ORAL | Status: DC
Start: 2015-01-19 — End: 2015-12-09

## 2015-01-19 MED ORDER — ONDANSETRON HCL 4 MG/2ML IJ SOLN
4.0000 mg | Freq: Once | INTRAMUSCULAR | Status: AC
Start: 2015-01-19 — End: 2015-01-19
  Administered 2015-01-19: 4 mg via INTRAVENOUS
  Filled 2015-01-19: qty 2

## 2015-01-19 MED ORDER — POLYETHYLENE GLYCOL 3350 17 G PO PACK: 17.0000 g | PACK | Freq: Every day | ORAL | Status: DC | PRN

## 2015-01-19 MED ORDER — MORPHINE SULFATE (PF) 4 MG/ML IV SOLN
4.0000 mg | Freq: Once | INTRAVENOUS | Status: AC
  Administered 2015-01-19: 4 mg via INTRAVENOUS
  Filled 2015-01-19: qty 1

## 2015-01-19 MED ORDER — SODIUM CHLORIDE 0.9 % IV BOLUS (SEPSIS)
1000.0000 mL | Freq: Once | INTRAVENOUS | Status: AC
Start: 2015-01-19 — End: 2015-01-19
  Administered 2015-01-19: 1000 mL via INTRAVENOUS

## 2015-01-19 NOTE — Discharge Instructions (Signed)
As we discussed, your workup was unremarkable. Your symptoms might be due to constipation. Take the miralax as needed.  Please see your primary care provider for diabetes control now that you have re-established insurance.   Return to the ER for new or worsening symptoms    Please obtain all of your results from medical records or have your doctors office obtain the results - share them with your doctor - you should be seen at your doctors office in the next 2 days. Call today to arrange your follow up. Take the medications as prescribed. Please review all of the medicines and only take them if you do not have an allergy to them. Please be aware that if you are taking birth control pills, taking other prescriptions, ESPECIALLY ANTIBIOTICS may make the birth control ineffective - if this is the case, either do not engage in sexual activity or use alternative methods of birth control such as condoms until you have finished the medicine and your family doctor says it is OK to restart them. If you are on a blood thinner such as COUMADIN, be aware that any other medicine that you take may cause the coumadin to either work too much, or not enough - you should have your coumadin level rechecked in next 7 days if this is the case.  ?  It is also a possibility that you have an allergic reaction to any of the medicines that you have been prescribed - Everybody reacts differently to medications and while MOST people have no trouble with most medicines, you may have a reaction such as nausea, vomiting, rash, swelling, shortness of breath. If this is the case, please stop taking the medicine immediately and contact your physician.  ?  You should return to the ER if you develop severe or worsening symptoms.

## 2015-01-19 NOTE — ED Notes (Signed)
Patient aware that urine specimen is needed.
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
## 2015-01-19 NOTE — ED Notes (Addendum)
Pt c/o abdominal pain and diarrhea starting this morning.  Pain score 6/10.  Denies seeing blood in stool.   Pt reports that pain began when he was attempting to have a BM.  Hx of diverticulitis w/ perforation.

## 2015-04-07 ENCOUNTER — Encounter (HOSPITAL_COMMUNITY): Payer: Self-pay | Admitting: Emergency Medicine

## 2015-04-07 ENCOUNTER — Emergency Department (HOSPITAL_COMMUNITY)
Admission: EM | Admit: 2015-04-07 | Discharge: 2015-04-07 | Disposition: A | Discharge status: Home / Self Care | Payer: Medicaid Other | Attending: Emergency Medicine | Admitting: Emergency Medicine

## 2015-04-07 DIAGNOSIS — Y998 Other external cause status: Secondary | ICD-10-CM | POA: Insufficient documentation

## 2015-04-07 DIAGNOSIS — T162XXA Foreign body in left ear, initial encounter: Secondary | ICD-10-CM | POA: Insufficient documentation

## 2015-04-07 DIAGNOSIS — I10 Essential (primary) hypertension: Secondary | ICD-10-CM | POA: Insufficient documentation

## 2015-04-07 DIAGNOSIS — Z8719 Personal history of other diseases of the digestive system: Secondary | ICD-10-CM | POA: Insufficient documentation

## 2015-04-07 DIAGNOSIS — Y9389 Activity, other specified: Secondary | ICD-10-CM | POA: Insufficient documentation

## 2015-04-07 DIAGNOSIS — Y9289 Other specified places as the place of occurrence of the external cause: Secondary | ICD-10-CM | POA: Insufficient documentation

## 2015-04-07 DIAGNOSIS — E119 Type 2 diabetes mellitus without complications: Secondary | ICD-10-CM | POA: Insufficient documentation

## 2015-04-07 DIAGNOSIS — F17210 Nicotine dependence, cigarettes, uncomplicated: Secondary | ICD-10-CM | POA: Insufficient documentation

## 2015-04-07 DIAGNOSIS — Z79899 Other long term (current) drug therapy: Secondary | ICD-10-CM | POA: Insufficient documentation

## 2015-04-07 DIAGNOSIS — X58XXXA Exposure to other specified factors, initial encounter: Secondary | ICD-10-CM | POA: Insufficient documentation

## 2015-04-07 NOTE — ED Notes (Signed)
Pt complaint of tissue stuck in ear post trying to get it out an hour ago; uses tissue to avoid sound at night; denies pain.

## 2015-04-07 NOTE — Discharge Instructions (Signed)
I recommend using earplugs that you can buy at [facility] are [facility] to use as a noise canceler at night. Refrain from pushing the ear beds into far with concern for rupturing your eardrum. Refrain from using Q-tips. Follow up with your primary care provider as needed.
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
## 2015-06-28 ENCOUNTER — Emergency Department (HOSPITAL_COMMUNITY)
Admission: EM | Admit: 2015-06-28 | Discharge: 2015-06-28 | Disposition: A | Discharge status: Home / Self Care | Payer: Medicaid Other | Attending: Emergency Medicine | Admitting: Emergency Medicine

## 2015-06-28 ENCOUNTER — Encounter (HOSPITAL_COMMUNITY): Payer: Self-pay | Admitting: Emergency Medicine

## 2015-06-28 DIAGNOSIS — Z8719 Personal history of other diseases of the digestive system: Secondary | ICD-10-CM | POA: Insufficient documentation

## 2015-06-28 DIAGNOSIS — F17210 Nicotine dependence, cigarettes, uncomplicated: Secondary | ICD-10-CM | POA: Insufficient documentation

## 2015-06-28 DIAGNOSIS — H1033 Unspecified acute conjunctivitis, bilateral: Secondary | ICD-10-CM

## 2015-06-28 DIAGNOSIS — E119 Type 2 diabetes mellitus without complications: Secondary | ICD-10-CM | POA: Insufficient documentation

## 2015-06-28 DIAGNOSIS — Z79899 Other long term (current) drug therapy: Secondary | ICD-10-CM | POA: Insufficient documentation

## 2015-06-28 DIAGNOSIS — I10 Essential (primary) hypertension: Secondary | ICD-10-CM | POA: Insufficient documentation

## 2015-06-28 MED ORDER — POLYMYXIN B-TRIMETHOPRIM 10000-0.1 UNIT/ML-% OP SOLN: 2.0000 [drp] | OPHTHALMIC | Status: DC
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
## 2015-06-28 NOTE — Discharge Instructions (Signed)

## 2015-06-28 NOTE — ED Notes (Signed)
Pt c/o left eye pain and discharge. States he woke up this morning with both eyes crusty and red. States he has had pink eye a couple times before and feels about the same.

## 2015-12-09 ENCOUNTER — Encounter (HOSPITAL_COMMUNITY): Payer: Self-pay

## 2015-12-09 ENCOUNTER — Emergency Department (HOSPITAL_COMMUNITY)
Admission: EM | Admit: 2015-12-09 | Discharge: 2015-12-09 | Disposition: A | Discharge status: Home / Self Care | Payer: Medicaid Other | Attending: Emergency Medicine | Admitting: Emergency Medicine

## 2015-12-09 DIAGNOSIS — R112 Nausea with vomiting, unspecified: Secondary | ICD-10-CM

## 2015-12-09 DIAGNOSIS — F17210 Nicotine dependence, cigarettes, uncomplicated: Secondary | ICD-10-CM | POA: Insufficient documentation

## 2015-12-09 DIAGNOSIS — R197 Diarrhea, unspecified: Secondary | ICD-10-CM | POA: Insufficient documentation

## 2015-12-09 DIAGNOSIS — R109 Unspecified abdominal pain: Secondary | ICD-10-CM

## 2015-12-09 DIAGNOSIS — E119 Type 2 diabetes mellitus without complications: Secondary | ICD-10-CM | POA: Insufficient documentation

## 2015-12-09 DIAGNOSIS — I10 Essential (primary) hypertension: Secondary | ICD-10-CM | POA: Insufficient documentation

## 2015-12-09 LAB — URINE MICROSCOPIC-ADD ON
Bacteria, UA: NONE SEEN
WBC, UA: NONE SEEN WBC/hpf (ref 0–5)

## 2015-12-09 LAB — CBC
HCT: 47 % (ref 39.0–52.0)
Hemoglobin: 15.4 g/dL (ref 13.0–17.0)
MCH: 26.1 pg (ref 26.0–34.0)
MCHC: 32.8 g/dL (ref 30.0–36.0)
MCV: 79.5 fL (ref 78.0–100.0)
PLATELETS: 290 10*3/uL (ref 150–400)
RBC: 5.91 MIL/uL — ABNORMAL HIGH (ref 4.22–5.81)
RDW: 13.2 % (ref 11.5–15.5)
WBC: 6.1 10*3/uL (ref 4.0–10.5)

## 2015-12-09 LAB — COMPREHENSIVE METABOLIC PANEL
ALBUMIN: 4.4 g/dL (ref 3.5–5.0)
ALK PHOS: 49 U/L (ref 38–126)
ALT: 30 U/L (ref 17–63)
AST: 22 U/L (ref 15–41)
Anion gap: 10 (ref 5–15)
BILIRUBIN TOTAL: 1.1 mg/dL (ref 0.3–1.2)
BUN: 15 mg/dL (ref 6–20)
CALCIUM: 9.2 mg/dL (ref 8.9–10.3)
CO2: 26 mmol/L (ref 22–32)
CREATININE: 1.03 mg/dL (ref 0.61–1.24)
Chloride: 100 mmol/L — ABNORMAL LOW (ref 101–111)
GFR calc Af Amer: >60 mL/min (ref >60)
GFR calc non Af Amer: >60 mL/min (ref >60)
GLUCOSE: 270 mg/dL — AB (ref 65–99)
Potassium: 4 mmol/L (ref 3.5–5.1)
SODIUM: 136 mmol/L (ref 135–145)
Total Protein: 7.3 g/dL (ref 6.5–8.1)

## 2015-12-09 LAB — URINALYSIS, ROUTINE W REFLEX MICROSCOPIC
BILIRUBIN URINE: NEGATIVE
Glucose, UA: >1000 mg/dL — AB
HGB URINE DIPSTICK: NEGATIVE
KETONES UR: NEGATIVE mg/dL
Leukocytes, UA: NEGATIVE
Nitrite: NEGATIVE
PROTEIN: NEGATIVE mg/dL
Specific Gravity, Urine: 1.036 — ABNORMAL HIGH (ref 1.005–1.030)
pH: 6.5 (ref 5.0–8.0)

## 2015-12-09 LAB — LIPASE, BLOOD: Lipase: 53 U/L — ABNORMAL HIGH (ref 11–51)
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
## 2015-12-09 NOTE — ED Triage Notes (Signed)
Pt c/o generalized abdominal pain and n/v/d x 1 day.  Pain score 3/10.  Pt reports taking OTC medications w/o relief.  Pt presents w/ daughter who is having the same symptoms.

## 2016-04-29 ENCOUNTER — Encounter (HOSPITAL_COMMUNITY): Payer: Self-pay | Admitting: Emergency Medicine

## 2016-04-29 ENCOUNTER — Emergency Department (HOSPITAL_COMMUNITY)
Admission: EM | Admit: 2016-04-29 | Discharge: 2016-04-29 | Disposition: A | Discharge status: Home / Self Care | Payer: Medicaid Other | Attending: Emergency Medicine | Admitting: Emergency Medicine

## 2016-04-29 DIAGNOSIS — I10 Essential (primary) hypertension: Secondary | ICD-10-CM | POA: Insufficient documentation

## 2016-04-29 DIAGNOSIS — Z79899 Other long term (current) drug therapy: Secondary | ICD-10-CM | POA: Insufficient documentation

## 2016-04-29 DIAGNOSIS — F17210 Nicotine dependence, cigarettes, uncomplicated: Secondary | ICD-10-CM | POA: Insufficient documentation

## 2016-04-29 DIAGNOSIS — E119 Type 2 diabetes mellitus without complications: Secondary | ICD-10-CM | POA: Insufficient documentation

## 2016-04-29 DIAGNOSIS — J0110 Acute frontal sinusitis, unspecified: Secondary | ICD-10-CM | POA: Insufficient documentation

## 2016-04-29 MED ORDER — AMOXICILLIN-POT CLAVULANATE 875-125 MG PO TABS: 1.0000 | ORAL_TABLET | Freq: Two times a day (BID) | ORAL | 0 refills | Status: DC

## 2016-04-29 NOTE — Discharge Instructions (Signed)
Please read attached information. If you experience any new or worsening signs or symptoms please return to the emergency room for evaluation. Please follow-up with your primary care provider or specialist as discussed. Please use medication prescribed only as directed and discontinue taking if you have any concerning signs or symptoms.   °
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
## 2016-04-29 NOTE — ED Triage Notes (Signed)
Patient c/o congestion, non productive cough, body aches, and headache x6 days. Reports taking advil with no relief. Denies abdominal pain, N/V/D.

## 2016-11-30 ENCOUNTER — Encounter (HOSPITAL_COMMUNITY): Payer: Self-pay | Admitting: *Deleted

## 2016-11-30 ENCOUNTER — Emergency Department (HOSPITAL_COMMUNITY)
Admission: EM | Admit: 2016-11-30 | Discharge: 2016-11-30 | Disposition: A | Discharge status: Home / Self Care | Payer: Self-pay | Attending: Emergency Medicine | Admitting: Emergency Medicine

## 2016-11-30 ENCOUNTER — Emergency Department (HOSPITAL_COMMUNITY): Payer: Self-pay

## 2016-11-30 DIAGNOSIS — I10 Essential (primary) hypertension: Secondary | ICD-10-CM | POA: Insufficient documentation

## 2016-11-30 DIAGNOSIS — Y999 Unspecified external cause status: Secondary | ICD-10-CM | POA: Insufficient documentation

## 2016-11-30 DIAGNOSIS — S93402A Sprain of unspecified ligament of left ankle, initial encounter: Secondary | ICD-10-CM | POA: Insufficient documentation

## 2016-11-30 DIAGNOSIS — E119 Type 2 diabetes mellitus without complications: Secondary | ICD-10-CM | POA: Insufficient documentation

## 2016-11-30 DIAGNOSIS — M25572 Pain in left ankle and joints of left foot: Secondary | ICD-10-CM

## 2016-11-30 DIAGNOSIS — W101XXA Fall (on)(from) sidewalk curb, initial encounter: Secondary | ICD-10-CM | POA: Insufficient documentation

## 2016-11-30 DIAGNOSIS — Y939 Activity, unspecified: Secondary | ICD-10-CM | POA: Insufficient documentation

## 2016-11-30 DIAGNOSIS — F17210 Nicotine dependence, cigarettes, uncomplicated: Secondary | ICD-10-CM | POA: Insufficient documentation

## 2016-11-30 DIAGNOSIS — Y92480 Sidewalk as the place of occurrence of the external cause: Secondary | ICD-10-CM | POA: Insufficient documentation

## 2016-11-30 MED ORDER — IBUPROFEN 400 MG PO TABS
600.0000 mg | ORAL_TABLET | Freq: Once | ORAL | Status: AC
  Administered 2016-11-30: 600 mg via ORAL
  Filled 2016-11-30: qty 1

## 2016-11-30 MED ORDER — IBUPROFEN 600 MG PO TABS: 600.0000 mg | ORAL_TABLET | Freq: Four times a day (QID) | ORAL | 0 refills | Status: DC | PRN

## 2016-11-30 NOTE — ED Notes (Signed)
Pt ambulatory to bathroom at this time.

## 2016-11-30 NOTE — Discharge Instructions (Signed)
You have an ankle sprain, treat pain with ibuprofen, continue to elevate ankle, apply ice, stay off of ankle as much as possible wear brace and use crutches. Pain should improve over the next several days and he should begin to be able to bear weight. If symptoms are not improving after the next several days please follow up with orthopedics. Return to the emergency department if pain becomes significantly worse, you develop worsening swelling and redness of the ankle, fevers or chills or other new or concerning symptoms develop.
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
## 2016-11-30 NOTE — Progress Notes (Signed)
Orthopedic Tech Progress Note Patient Details:  Alan Williams 1981-01-31 [id number]  Ortho Devices Type of Ortho Device: ASO, Crutches Ortho Device/Splint Location: LLE Ortho Device/Splint Interventions: Ordered, Application, Adjustment   [name] 11/30/2016, 1:52 PM

## 2016-11-30 NOTE — ED Triage Notes (Signed)
To ED for eval after tripping last pm and rolling his left ankle. States he is unable to bear weight. OTC meds taken at home with out relief

## 2017-09-26 ENCOUNTER — Other Ambulatory Visit: Payer: Self-pay

## 2017-09-26 ENCOUNTER — Emergency Department (HOSPITAL_COMMUNITY): Payer: Self-pay

## 2017-09-26 ENCOUNTER — Emergency Department (HOSPITAL_COMMUNITY)
Admission: EM | Admit: 2017-09-26 | Discharge: 2017-09-26 | Disposition: A | Discharge status: Home / Self Care | Payer: Self-pay | Attending: Emergency Medicine | Admitting: Emergency Medicine

## 2017-09-26 ENCOUNTER — Encounter (HOSPITAL_COMMUNITY): Payer: Self-pay | Admitting: *Deleted

## 2017-09-26 DIAGNOSIS — E119 Type 2 diabetes mellitus without complications: Secondary | ICD-10-CM | POA: Insufficient documentation

## 2017-09-26 DIAGNOSIS — Y999 Unspecified external cause status: Secondary | ICD-10-CM | POA: Insufficient documentation

## 2017-09-26 DIAGNOSIS — S0990XA Unspecified injury of head, initial encounter: Secondary | ICD-10-CM | POA: Insufficient documentation

## 2017-09-26 DIAGNOSIS — F17210 Nicotine dependence, cigarettes, uncomplicated: Secondary | ICD-10-CM | POA: Insufficient documentation

## 2017-09-26 DIAGNOSIS — Z23 Encounter for immunization: Secondary | ICD-10-CM | POA: Insufficient documentation

## 2017-09-26 DIAGNOSIS — E1165 Type 2 diabetes mellitus with hyperglycemia: Secondary | ICD-10-CM | POA: Insufficient documentation

## 2017-09-26 DIAGNOSIS — Z79899 Other long term (current) drug therapy: Secondary | ICD-10-CM | POA: Insufficient documentation

## 2017-09-26 DIAGNOSIS — I10 Essential (primary) hypertension: Secondary | ICD-10-CM | POA: Insufficient documentation

## 2017-09-26 DIAGNOSIS — R739 Hyperglycemia, unspecified: Secondary | ICD-10-CM

## 2017-09-26 DIAGNOSIS — Y9389 Activity, other specified: Secondary | ICD-10-CM | POA: Insufficient documentation

## 2017-09-26 DIAGNOSIS — W0110XA Fall on same level from slipping, tripping and stumbling with subsequent striking against unspecified object, initial encounter: Secondary | ICD-10-CM | POA: Insufficient documentation

## 2017-09-26 DIAGNOSIS — Y929 Unspecified place or not applicable: Secondary | ICD-10-CM | POA: Insufficient documentation

## 2017-09-26 DIAGNOSIS — R55 Syncope and collapse: Secondary | ICD-10-CM

## 2017-09-26 LAB — CBC WITH DIFFERENTIAL/PLATELET
ABS IMMATURE GRANULOCYTES: 0.1 10*3/uL (ref 0.0–0.1)
BASOS PCT: 1 %
Basophils Absolute: 0 10*3/uL (ref 0.0–0.1)
Eosinophils Absolute: 0 10*3/uL (ref 0.0–0.7)
Eosinophils Relative: 1 %
HCT: 51.2 % (ref 39.0–52.0)
HEMOGLOBIN: 16.3 g/dL (ref 13.0–17.0)
IMMATURE GRANULOCYTES: 1 %
LYMPHS ABS: 1.2 10*3/uL (ref 0.7–4.0)
LYMPHS PCT: 33 %
MCH: 26 pg (ref 26.0–34.0)
MCHC: 31.8 g/dL (ref 30.0–36.0)
MCV: 81.5 fL (ref 78.0–100.0)
MONO ABS: 0.6 10*3/uL (ref 0.1–1.0)
MONOS PCT: 18 %
NEUTROS ABS: 1.7 10*3/uL (ref 1.7–7.7)
NEUTROS PCT: 46 %
PLATELETS: 266 10*3/uL (ref 150–400)
RBC: 6.28 MIL/uL — ABNORMAL HIGH (ref 4.22–5.81)
RDW: 13 % (ref 11.5–15.5)
WBC: 3.7 10*3/uL — ABNORMAL LOW (ref 4.0–10.5)

## 2017-09-26 LAB — BASIC METABOLIC PANEL
ANION GAP: 14 (ref 5–15)
BUN: 10 mg/dL (ref 6–20)
CHLORIDE: 96 mmol/L — AB (ref 98–111)
CO2: 26 mmol/L (ref 22–32)
Calcium: 10.1 mg/dL (ref 8.9–10.3)
Creatinine, Ser: 0.98 mg/dL (ref 0.61–1.24)
GFR calc Af Amer: >60 mL/min (ref >60)
Glucose, Bld: 374 mg/dL — ABNORMAL HIGH (ref 70–99)
POTASSIUM: 5 mmol/L (ref 3.5–5.1)
SODIUM: 136 mmol/L (ref 135–145)

## 2017-09-26 MED ORDER — SODIUM CHLORIDE 0.9 % IV BOLUS
2000.0000 mL | Freq: Once | INTRAVENOUS | Status: AC
  Administered 2017-09-26: 2000 mL via INTRAVENOUS

## 2017-09-26 MED ORDER — TETANUS-DIPHTH-ACELL PERTUSSIS 5-2.5-18.5 LF-MCG/0.5 IM SUSP
0.5000 mL | Freq: Once | INTRAMUSCULAR | Status: AC
  Administered 2017-09-26: 0.5 mL via INTRAMUSCULAR
  Filled 2017-09-26: qty 0.5

## 2017-09-26 NOTE — ED Provider Notes (Signed)
MSE was initiated and I personally evaluated the patient and placed orders (if any) at  4:50 PM on September 26, 2017.  The patient appears stable so that the remainder of the MSE may be completed by another provider.  Patient placed in Quick Look pathway, seen and evaluated   Chief Complaint: Syncope and head trauma  HPI:   Patient is a 37 year old male with a history of hypertension, diabetes mellitus presenting for syncopal episode and head trauma 2 days ago.  Patient reports that he was in a "coughing fit", and it caused him to syncopized, wake up on the floor, and he fell forward onto his face.  Patient denies any chest pain or shortness of breath prior to this episode.  Patient reports that it is occurred before where he coughed so hard that he signifies.  Patient reports that since the incident, he has had dizziness, sensation of the room is spinning, and fatigue.  Patient also reporting midline neck tenderness and low back pain.  No weakness or numbness in extremities.  ROS: See HPI (one)  Physical Exam:   Gen: No distress  Neuro: Awake and Alert  Skin: Warm    Focused Exam: Abrasions noted to right anterior forehead and overlying nasal bone.  Patient has midline tenderness of the cervical spine, around the level of the C7 vertebrae.  Patient has paraspinal cervical muscular tenderness.  No thoracic midline tenderness or lumbar midline tenderness.  Strength 5 out of 5 in upper and lower extremities.  Pupils equal,, reactive to light.  No hemotympanum.  No battle sign.   Initiation of care has begun. The patient has been counseled on the process, plan, and necessity for staying for the completion/evaluation, and the remainder of the medical screening examination    Delia ChimesMurray, Ellanor Feuerstein B, PA-C 09/26/17 1654    Margarita Grizzleay, Danielle, MD 09/28/17 1715
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
## 2017-09-26 NOTE — ED Triage Notes (Signed)
Pt in stating a few days ago he had a coughing spell and coughed so hard he passed out and fell and hit his head, today he woke up with dizziness and a headache, no distress noted

## 2017-09-26 NOTE — ED Provider Notes (Signed)
Syncope a few days ago, none since Orthostatic since Hyperglycemic (h/o DM), polyuria Getting fluids  Plan: discharge home after fluids.  Recheck after fluids. The patient is feeling well and agreeable with plan of discharge. He can go home per plan of previous treatment team.    Elpidio AnisUpstill, Jovanny Stephanie, PA-C 09/26/17 2331    Loren RacerYelverton, David, MD 09/29/17 [phone]

## 2017-11-17 ENCOUNTER — Encounter: Payer: Self-pay | Admitting: Family Medicine

## 2017-11-17 ENCOUNTER — Ambulatory Visit: Payer: Self-pay | Attending: Family Medicine | Admitting: Family Medicine

## 2017-11-17 ENCOUNTER — Telehealth: Payer: Self-pay | Admitting: Family Medicine

## 2017-11-17 VITALS — BP 137/95 | HR 95 | Temp 98.2°F | Resp 18 | Ht 66.0 in | Wt 207.0 lb

## 2017-11-17 DIAGNOSIS — M545 Low back pain: Secondary | ICD-10-CM | POA: Insufficient documentation

## 2017-11-17 DIAGNOSIS — I10 Essential (primary) hypertension: Secondary | ICD-10-CM | POA: Insufficient documentation

## 2017-11-17 DIAGNOSIS — F1290 Cannabis use, unspecified, uncomplicated: Secondary | ICD-10-CM | POA: Insufficient documentation

## 2017-11-17 DIAGNOSIS — G8929 Other chronic pain: Secondary | ICD-10-CM | POA: Insufficient documentation

## 2017-11-17 DIAGNOSIS — Z2821 Immunization not carried out because of patient refusal: Secondary | ICD-10-CM | POA: Insufficient documentation

## 2017-11-17 DIAGNOSIS — K5792 Diverticulitis of intestine, part unspecified, without perforation or abscess without bleeding: Secondary | ICD-10-CM | POA: Insufficient documentation

## 2017-11-17 DIAGNOSIS — E1165 Type 2 diabetes mellitus with hyperglycemia: Secondary | ICD-10-CM

## 2017-11-17 DIAGNOSIS — F17210 Nicotine dependence, cigarettes, uncomplicated: Secondary | ICD-10-CM | POA: Insufficient documentation

## 2017-11-17 LAB — POCT GLYCOSYLATED HEMOGLOBIN (HGB A1C): HbA1c, POC (controlled diabetic range): 13.2 % — AB (ref 0.0–7.0)

## 2017-11-17 LAB — GLUCOSE, POCT (MANUAL RESULT ENTRY)
POC Glucose: 403 mg/dL — AB (ref 70–99)
POC Glucose: 360 mg/dL — AB (ref 70–99)

## 2017-11-17 LAB — POCT URINALYSIS DIP (CLINITEK)
Bilirubin, UA: NEGATIVE
Blood, UA: NEGATIVE
Glucose, UA: >=1000 mg/dL — AB
Leukocytes, UA: NEGATIVE
Nitrite, UA: NEGATIVE
POC,PROTEIN,UA: NEGATIVE
Spec Grav, UA: 1.02
Urobilinogen, UA: 0.2 U/dL
pH, UA: 5.5

## 2017-11-17 MED ORDER — TRUEPLUS LANCETS 28G MISC: 6 refills | Status: DC

## 2017-11-17 MED ORDER — INSULIN ASPART 100 UNIT/ML ~~LOC~~ SOLN
12.0000 [IU] | Freq: Once | SUBCUTANEOUS | Status: AC
  Administered 2017-11-17: 12 [IU] via SUBCUTANEOUS

## 2017-11-17 MED ORDER — GLUCOSE BLOOD VI STRP: ORAL_STRIP | 12 refills | Status: DC

## 2017-11-17 MED ORDER — TRUE METRIX AIR GLUCOSE METER W/DEVICE KIT: 1.0000 | PACK | Freq: Three times a day (TID) | 0 refills | Status: DC

## 2017-11-17 MED ORDER — METFORMIN HCL 500 MG PO TABS: 500.0000 mg | ORAL_TABLET | Freq: Two times a day (BID) | ORAL | 3 refills | Status: DC

## 2017-11-17 MED ORDER — GLIMEPIRIDE 4 MG PO TABS: 4.0000 mg | ORAL_TABLET | Freq: Every day | ORAL | 3 refills | Status: DC

## 2017-11-17 MED FILL — !TRUE METRIX BLOOD GLUCOSE: 30 days supply | Qty: 1 | Fill #0

## 2017-11-17 MED FILL — metFORMIN HCL 500 MG TABS: 500 | 30 days supply | Qty: 60 | Fill #0

## 2017-11-17 MED FILL — GLIMEPIRIDE 4 MG TABLET: 4 | 30 days supply | Qty: 30 | Fill #0

## 2017-11-17 MED FILL — TRUEplus LANCETS 28G MISC: 30 days supply | Qty: 100 | Fill #0

## 2017-11-17 MED FILL — TRUE METRIX TEST STRIP: 33 days supply | Qty: 100 | Fill #0

## 2017-11-17 NOTE — Patient Instructions (Signed)

## 2017-11-17 NOTE — Telephone Encounter (Signed)
Patient called back to give blood sugar result it was 353 after he ate. Please follow up with patient.
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
## 2017-11-18 LAB — LIPID PANEL
Chol/HDL Ratio: 4.8 ratio (ref 0.0–5.0)
Cholesterol, Total: 196 mg/dL (ref 100–199)
HDL: 41 mg/dL
LDL Calculated: 121 mg/dL — ABNORMAL HIGH (ref 0–99)
Triglycerides: 171 mg/dL — ABNORMAL HIGH (ref 0–149)
VLDL Cholesterol Cal: 34 mg/dL (ref 5–40)

## 2017-11-18 LAB — COMPREHENSIVE METABOLIC PANEL WITH GFR
ALT: 24 IU/L (ref 0–44)
AST: 23 IU/L (ref 0–40)
Albumin/Globulin Ratio: 1.7 (ref 1.2–2.2)
Albumin: 4.5 g/dL (ref 3.5–5.5)
Alkaline Phosphatase: 84 IU/L (ref 39–117)
BUN/Creatinine Ratio: 18 (ref 9–20)
BUN: 17 mg/dL (ref 6–20)
Bilirubin Total: 0.4 mg/dL (ref 0.0–1.2)
CO2: 21 mmol/L (ref 20–29)
Calcium: 9.6 mg/dL (ref 8.7–10.2)
Chloride: 98 mmol/L (ref 96–106)
Creatinine, Ser: 0.97 mg/dL (ref 0.76–1.27)
GFR calc Af Amer: 113 mL/min/1.73
GFR calc non Af Amer: 98 mL/min/1.73
Globulin, Total: 2.6 g/dL (ref 1.5–4.5)
Glucose: 346 mg/dL — ABNORMAL HIGH (ref 65–99)
Potassium: 4.4 mmol/L (ref 3.5–5.2)
Sodium: 135 mmol/L (ref 134–144)
Total Protein: 7.1 g/dL (ref 6.0–8.5)

## 2017-11-18 LAB — MICROALBUMIN / CREATININE URINE RATIO
Creatinine, Urine: 76.4 mg/dL
Microalb/Creat Ratio: 25.9 mg/g{creat} (ref 0.0–30.0)
Microalbumin, Urine: 19.8 ug/mL

## 2017-11-18 NOTE — Telephone Encounter (Signed)
[place], please call patient and ask what his blood sugars are today as he was started on Amaryl/glimeperide and today would have been the first day that he took the glimeperide along with his metformin

## 2017-11-21 ENCOUNTER — Telehealth: Payer: Self-pay

## 2017-11-21 NOTE — Telephone Encounter (Signed)
Attempted pt contact for lab results and teaching.  Left message.

## 2017-11-23 ENCOUNTER — Telehealth: Payer: Self-pay | Admitting: *Deleted

## 2017-11-23 NOTE — Telephone Encounter (Signed)
-----   Message from Cain Saupe, MD sent at 11/20/2017  6:50 PM EDT ----- Notify patient that his glucose was 346 on his recent CMP which was otherwise normal. Find out how his blood sugars are running now since being on medication. His lipid panel showed an increase in his TG at 171 (goal of 150 or less) and his bad cholesterol, LDL was 121 and because he is diabetic his goal LDL is 70 or less. I would suggest that he also be on a medication to help lower his cholesterol in addition to a low fat diet and regular exercise. If he agrees to take a cholesterol medication, to which pharmacy would he like the medication sent? Also remind him to keep his upcoming appointment with the clinical pharmacist

## 2017-11-23 NOTE — Telephone Encounter (Signed)
No answer to call.  VM left for home number to call back for message from his doctor.

## 2017-11-23 NOTE — Telephone Encounter (Signed)
Medical Assistant left message on patient's home and cell voicemail. Voicemail states to give a call back to [place] with [facility] at [phone]. !!!Please ask patient if he is willing to take a cholesterol medication due to the levels being elevated and needing to be under 70 with him being diabetic. Patient should keep CPP appointment and bring his recent numbers with him. Please ask patient how his numbers have been since starting medications on 11/20/17.

## 2017-11-23 NOTE — Telephone Encounter (Signed)
Pt called back.  Lab results given and nurse assisted pt is making plans to take control over his diabetes. Over the last 2 yrs he has lost 100 lbs. This was used to encouraged pt that he can conquer anything.  Strategies to encourage himself when he gets discouraged. Wife is dietician and he agreed to let her make healthy foods, which she has been wanting to do.  He has started walking since getting high blood sugars.  Encouraged pt to get free app for mobile phone that acts as a fit bit.  We talked about making goals to work towards.   Nurse found pt had been taking BS after eating.  Educated on preprandial BS before breakfast and dinner. Also spoke to pt about writing BS in a log and bring it to pharmacist appt.on 12/01/17.

## 2017-12-01 ENCOUNTER — Ambulatory Visit: Payer: Self-pay | Attending: Family Medicine | Admitting: Pharmacist

## 2017-12-01 ENCOUNTER — Other Ambulatory Visit: Payer: Self-pay | Admitting: Family Medicine

## 2017-12-01 ENCOUNTER — Encounter: Payer: Self-pay | Admitting: Pharmacist

## 2017-12-01 DIAGNOSIS — E1165 Type 2 diabetes mellitus with hyperglycemia: Secondary | ICD-10-CM

## 2017-12-01 DIAGNOSIS — F17210 Nicotine dependence, cigarettes, uncomplicated: Secondary | ICD-10-CM | POA: Insufficient documentation

## 2017-12-01 DIAGNOSIS — H538 Other visual disturbances: Secondary | ICD-10-CM

## 2017-12-01 DIAGNOSIS — Z7984 Long term (current) use of oral hypoglycemic drugs: Secondary | ICD-10-CM | POA: Insufficient documentation

## 2017-12-01 LAB — GLUCOSE, POCT (MANUAL RESULT ENTRY): POC Glucose: 247 mg/dL — AB (ref 70–99)

## 2017-12-01 MED ORDER — METFORMIN HCL 500 MG PO TABS: 1000.0000 mg | ORAL_TABLET | Freq: Two times a day (BID) | ORAL | 2 refills | Status: DC

## 2017-12-01 MED FILL — metFORMIN HCL 500 MG TABS: 500 | 30 days supply | Qty: 120 | Fill #0

## 2017-12-01 NOTE — Progress Notes (Signed)
    S:    PCP: Dr. Jillyn Hidden  No chief complaint on file.  Patient arrives in good spirits. Presents for diabetes management at the request of Dr. Jillyn Hidden. Patient was referred on 11/17/17.  At that visit, he reported not taking anti-hyperglycemic medications for the past 2 years.A1c resulted at 13.2. Metformin and glimepiride were initiated.   Family/Social History:  - FH: nothing pertinent listed - Tobacco: 3-4 cigarettes on the weekend - Alcohol: "a couple of drinks on the weekend"  Insurance coverage/medication affordability:  - Self-pay; [facility] pharmacy  Patient reports adherence with medications.  Current diabetes medications include:  - Glimepiride 4 mg daily before breakfast - Metformin 500 mg BID  Patient denies hypoglycemic events.   Patient reported dietary habits: Eats 2-3 meals/day - High intake of white carbs  - Denies sweets, sodas - Reports wife is a dietician  Patient-reported exercise habits:  - 40 minutes of walking day -5-15 minutes on exercise bike after eating   Patient reports improvement in polyuria, polydipsia, and polyphagia.  Patient denies neuropathy. Patient reports visual changes. Patient reports self foot exams.   O:  POCT: 247 Home glucose levels:  - reports monitoring "correctly" only for the past several days - Highest number ~240-250 range - Lowest number 150 (FPG)  Lab Results  Component Value Date   HGBA1C 13.2 (A) 11/17/2017   There were no vitals filed for this visit.  Lipid Panel     Component Value Date/Time   CHOL 196 11/17/2017 1105   TRIG 171 (H) 11/17/2017 1105   HDL 41 11/17/2017 1105   CHOLHDL 4.8 11/17/2017 1105   CHOLHDL 3.6 08/27/2010 0521   VLDL 50 (H) 08/27/2010 0521   LDLCALC 121 (H) 11/17/2017 1105    Clinical ASCVD: No   ASCVD risk: pt is 39; cannot calculate  A/P: Diabetes longstanding currently uncontrolled based on A1c of 13. Patient is able to verbalize appropriate hypoglycemia management plan. Patient  is adherent with medication. Control is suboptimal due to dietary indiscretion.  Pt reports improvement of hyperglycemia-associated symptoms since re-starting medication. His A1c makes him a candidate for combination injection therapy. However, he has responded well to oral therapy and is motivated to make lifestyle changes. Cost is a barrier with him. He agrees to increase metformin for now. I have emphasized that we may have to introduce a GLP-1 RA or insulin if he cannot achieve control with oral therapy. Would recommend to continue glimepiride for now as a short-term solution to high sugars but this should be discontinued in the future.   -Increased dose of metformin to 1000 mg BID. (patient takes two tablets of 500 mg BID) -Continued glimepiride 4 mg before breakfast.  -Extensively discussed pathophysiology of DM, recommended lifestyle interventions, dietary effects on glycemic control -Counseled on s/sx of and management of hypoglycemia -Next A1C anticipated 02/2017.   ASCVD risk - primary prevention in patient with DM. Last LDL is not controlled. ASCVD risk score cannot be calculated. At least moderate intensity statin indicated.  - Will address at future encounter  Written patient instructions provided.  Total time in face to face counseling 30 minutes.   Follow up PCP Clinic Visit in 2 weeks.     Patient seen with: Leanne Chang, PharmD Candidate [employer] Class of 2021  Butch Penny, PharmD, CPP Clinical Pharmacist [facility] [phone]

## 2017-12-01 NOTE — Patient Instructions (Signed)
Thank you for coming to see me today. Please do the following:  1. Increase metformin to 2 tablets 2 times a day. 2. Continue checking blood sugars at home. Check fasting and 2 hours after your largest meal of the day. 3. Continue making the lifestyle changes we've discussed together during our visit. Diet and exercise play a significant role in improving your blood sugars.  4. Follow-up with me after you see Dr. Jillyn Hidden.  Hypoglycemia or low blood sugar:   Low blood sugar can happen quickly and may become an emergency if not treated right away.   While this shouldn't happen often, it can be brought upon if you skip a meal or do not eat enough. Also, if your insulin or other diabetes medications are dosed too high, this can cause your blood sugar to go to low.   Warning signs of low blood sugar include: 1. Feeling shaky or dizzy 2. Feeling weak or tired  3. Excessive hunger 4. Feeling anxious or upset  5. Sweating even when you aren't exercising  What to do if I experience low blood sugar? 1. Check your blood sugar with your meter. If lower than 70, proceed to step 2.  2. Treat with 3-4 glucose tablets or 3 packets of regular sugar. If these aren't around, you can try hard candy. Yet another option would be to drink 4 ounces of fruit juice or 6 ounces of REGULAR soda.  3. Re-check your sugar in 15 minutes. If it is still below 70, do what you did in step 2 again. If has come back up, go ahead and eat a snack or small meal at this time.

## 2017-12-01 NOTE — Progress Notes (Signed)
Patient ID: Alan Williams, male   DOB: 1980-10-10, 37 y.o.   MRN: [id number]   Patient recently saw the clinical pharmacist in follow-up of diabetes and per clinical pharmacist, patient with complaint of blurred vision in the right eye.  Patient will be referred to ophthalmology in follow-up of his blurred vision as well as for diabetic eye exam.

## 2017-12-15 ENCOUNTER — Encounter: Payer: Self-pay | Admitting: Family Medicine

## 2017-12-15 ENCOUNTER — Other Ambulatory Visit: Payer: Self-pay

## 2017-12-15 ENCOUNTER — Ambulatory Visit: Payer: Self-pay | Attending: Family Medicine | Admitting: Family Medicine

## 2017-12-15 VITALS — BP 137/94 | HR 100 | Temp 98.4°F | Resp 20 | Ht 68.0 in | Wt 220.6 lb

## 2017-12-15 DIAGNOSIS — F17200 Nicotine dependence, unspecified, uncomplicated: Secondary | ICD-10-CM

## 2017-12-15 DIAGNOSIS — E785 Hyperlipidemia, unspecified: Secondary | ICD-10-CM

## 2017-12-15 DIAGNOSIS — I10 Essential (primary) hypertension: Secondary | ICD-10-CM | POA: Insufficient documentation

## 2017-12-15 DIAGNOSIS — R1030 Lower abdominal pain, unspecified: Secondary | ICD-10-CM

## 2017-12-15 DIAGNOSIS — Z79899 Other long term (current) drug therapy: Secondary | ICD-10-CM

## 2017-12-15 DIAGNOSIS — Z8249 Family history of ischemic heart disease and other diseases of the circulatory system: Secondary | ICD-10-CM | POA: Insufficient documentation

## 2017-12-15 DIAGNOSIS — E1169 Type 2 diabetes mellitus with other specified complication: Secondary | ICD-10-CM

## 2017-12-15 DIAGNOSIS — R030 Elevated blood-pressure reading, without diagnosis of hypertension: Secondary | ICD-10-CM

## 2017-12-15 DIAGNOSIS — E1165 Type 2 diabetes mellitus with hyperglycemia: Secondary | ICD-10-CM

## 2017-12-15 DIAGNOSIS — T383X5A Adverse effect of insulin and oral hypoglycemic [antidiabetic] drugs, initial encounter: Secondary | ICD-10-CM | POA: Insufficient documentation

## 2017-12-15 DIAGNOSIS — F17210 Nicotine dependence, cigarettes, uncomplicated: Secondary | ICD-10-CM | POA: Insufficient documentation

## 2017-12-15 DIAGNOSIS — Z2821 Immunization not carried out because of patient refusal: Secondary | ICD-10-CM | POA: Insufficient documentation

## 2017-12-15 DIAGNOSIS — Z7984 Long term (current) use of oral hypoglycemic drugs: Secondary | ICD-10-CM | POA: Insufficient documentation

## 2017-12-15 DIAGNOSIS — K5792 Diverticulitis of intestine, part unspecified, without perforation or abscess without bleeding: Secondary | ICD-10-CM | POA: Insufficient documentation

## 2017-12-15 DIAGNOSIS — K521 Toxic gastroenteritis and colitis: Secondary | ICD-10-CM

## 2017-12-15 LAB — GLUCOSE, POCT (MANUAL RESULT ENTRY): POC Glucose: 183 mg/dL — AB (ref 70–99)

## 2017-12-15 MED ORDER — GLIMEPIRIDE 4 MG PO TABS: 4.0000 mg | ORAL_TABLET | Freq: Every day | ORAL | 11 refills | Status: DC

## 2017-12-15 MED ORDER — ATORVASTATIN CALCIUM 10 MG PO TABS: 10.0000 mg | ORAL_TABLET | Freq: Every day | ORAL | 3 refills | Status: DC

## 2017-12-15 MED ORDER — LOSARTAN POTASSIUM 25 MG PO TABS: 25.0000 mg | ORAL_TABLET | Freq: Every day | ORAL | 6 refills | Status: DC

## 2017-12-15 MED FILL — GLIMEPIRIDE 4 MG TABS: 4 | 30 days supply | Qty: 30 | Fill #0

## 2017-12-15 MED FILL — ATORVASTATIN 10 MG TABLET: 10 | 30 days supply | Qty: 30 | Fill #0

## 2017-12-15 MED FILL — LOSARTAN POTASSIUM 25 MG TA: 25 | 30 days supply | Qty: 30 | Fill #0

## 2017-12-15 NOTE — Progress Notes (Signed)
Pain: 0  Complaint of metformin upsets stomach, bs are better then they have been.   Refill on glimepiride  Patient stated that he ate 15 mins ago and took his medications at the same time he ate.
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
## 2017-12-29 ENCOUNTER — Ambulatory Visit: Payer: Self-pay | Attending: Family Medicine | Admitting: Pharmacist

## 2017-12-29 VITALS — BP 115/82 | HR 93

## 2017-12-29 DIAGNOSIS — E1165 Type 2 diabetes mellitus with hyperglycemia: Secondary | ICD-10-CM

## 2017-12-29 LAB — GLUCOSE, POCT (MANUAL RESULT ENTRY): POC Glucose: 210 mg/dL — AB (ref 70–99)

## 2017-12-29 NOTE — Progress Notes (Signed)
    S:    PCP: Dr. Jillyn HiddenFulp  No chief complaint on file.  Patient arrives in good spirits. Presents for diabetes management and BP re-check at the request of Dr. Jillyn HiddenFulp. Patient was referred on 12/15/17. BP was elevated at that visit. Pt was started on losartan 25 mg daily.  1. DM Patient confirms adherence with medications.Current diabetes medications include: glimepiride 4 mg daily before breakfast and metformin 500 mg: 2 tablets BID. Patient reports hypoglycemic events. Gives several objective values in the 60s.   Patient reports limiting white bread during the day. Denies drinking soda  Patient reports getting 40 minutes of walking daily. Spends 5-15 minutes on exercise bike after eating in the evening.    Patient reports improvement in polyuria, polydipsia, and polyphagia. Denies neuropathy. Performs daily self foot exams.   2. HTN  Denies CP, SOB, HA, or blurred vision.   Reports adherence to losartan 25 mg daily.    Family/Social History:  - FHx: HTN (relatives unspecified) - Tobacco: 3-4 cigarettes on the weekend - Alcohol: "a couple of drinks on the weekend"  Insurance coverage/medication affordability:  - Self-pay  O:  BP in L arm after 5 minutes rest: 115/82, HR 93  POCT glucose: 210  Home glucose levels - patient brings in log Fasting: 62 - 134 Post-prandial/radom: 140 -199  Lab Results  Component Value Date   HGBA1C 13.2 (A) 11/17/2017   There were no vitals filed for this visit.  Lipid Panel     Component Value Date/Time   CHOL 196 11/17/2017 1105   TRIG 171 (H) 11/17/2017 1105   HDL 41 11/17/2017 1105   CHOLHDL 4.8 11/17/2017 1105   CHOLHDL 3.6 08/27/2010 0521   VLDL 50 (H) 08/27/2010 0521   LDLCALC 121 (H) 11/17/2017 1105    Clinical ASCVD: No   ASCVD risk: not calculated d/t age  A/P: Diabetes longstanding currently uncontrolled based on A1c of 13. Patient is able to verbalize appropriate hypoglycemia management plan. Patient is adherent with  medication. Control is suboptimal due to dietary indiscretion.  He is having hypoglycemia. Diarrhea with metformin now resolved. Will discontinue glimepiride and continue metformin. His values at home look much better.   -Continued metformin to 1000 mg BID. (patient takes two tablets of 500 mg BID) -Stop glimepiride  -Extensively discussed pathophysiology of DM, recommended lifestyle interventions, dietary effects on glycemic control -Counseled on s/sx of and management of hypoglycemia -Next A1C anticipated 02/2017.  - HM: Pneumovax given; pt declined influenza  ASCVD risk - primary prevention in patient with DM. Last LDL is not controlled. ASCVD risk score not calculated d/t age. At least moderate intensity statin indicated at this time.  - Continued atorvastatin 10 mg  HTN - Hypertension longstanding close to goal. BP goal < 130/80. Pt with DBP above goal at 82. Will continue losartan at current dose for now. Pt has only been taking for 2 weeks. Will reassess in 1 month.  - Continued losartan 25 mg daily  Written patient instructions provided.  Total time in face to face counseling 30 minutes.   Follow up PCP Clinic Visit in 2 weeks.     Patient seen with: Leanne ChangJane Chu, PharmD Candidate [facility] [employer] of 2021  Butch PennyLuke Van Ausdall, PharmD, CPP Clinical Pharmacist [facility] [phone]

## 2017-12-29 NOTE — Patient Instructions (Addendum)
Thank you for coming to see me today. Please do the following:  1. Stop glimepiride.  2. Continue metformin 2 tablets 2 times a day. 3. Continue losartan 25 mg.  4. Continue checking blood sugars at home. They look great! Good job! 5. Continue making the lifestyle changes we've discussed together during our visit. Diet and exercise play a significant role in improving your blood sugars.  6. Follow-up with me in 1 month.  Hypoglycemia or low blood sugar:   Low blood sugar can happen quickly and may become an emergency if not treated right away.   While this shouldn't happen often, it can be brought upon if you skip a meal or do not eat enough. Also, if your insulin or other diabetes medications are dosed too high, this can cause your blood sugar to go to low.   Warning signs of low blood sugar include: 1. Feeling shaky or dizzy 2. Feeling weak or tired  3. Excessive hunger 4. Feeling anxious or upset  5. Sweating even when you aren't exercising  What to do if I experience low blood sugar? 1. Check your blood sugar with your meter. If lower than 70, proceed to step 2.  2. Treat with 3-4 glucose tablets or 3 packets of regular sugar. If these aren't around, you can try hard candy. Yet another option would be to drink 4 ounces of fruit juice or 6 ounces of REGULAR soda.  3. Re-check your sugar in 15 minutes. If it is still below 70, do what you did in step 2 again. If has come back up, go ahead and eat a snack or small meal at this time.

## 2018-01-11 MED FILL — TRUE METRIX TEST STRIP: 33 days supply | Qty: 100 | Fill #1

## 2018-01-17 MED FILL — metFORMIN HCL 500 MG TABS: 500 | 30 days supply | Qty: 120 | Fill #1

## 2018-01-17 MED FILL — ATORVASTATIN 10 MG TABLET: 10 | 30 days supply | Qty: 30 | Fill #1

## 2018-01-17 MED FILL — LOSARTAN POTASSIUM 25 MG TA: 25 | 30 days supply | Qty: 30 | Fill #1

## 2018-01-30 ENCOUNTER — Encounter: Payer: Self-pay | Admitting: Pharmacist

## 2018-01-30 ENCOUNTER — Ambulatory Visit: Payer: Self-pay | Attending: Family Medicine | Admitting: Pharmacist

## 2018-01-30 DIAGNOSIS — I10 Essential (primary) hypertension: Secondary | ICD-10-CM

## 2018-01-30 NOTE — Progress Notes (Signed)
Pt left without being seen.

## 2018-02-27 MED FILL — metFORMIN HCL 500 MG TABS: 500 | 30 days supply | Qty: 120 | Fill #2

## 2018-03-17 ENCOUNTER — Ambulatory Visit: Payer: Self-pay | Admitting: Family Medicine

## 2018-04-24 ENCOUNTER — Telehealth: Payer: Self-pay | Admitting: Family Medicine

## 2018-04-24 MED ORDER — METFORMIN HCL 500 MG PO TABS: 1000.0000 mg | ORAL_TABLET | Freq: Two times a day (BID) | ORAL | 0 refills | Status: DC

## 2018-04-24 MED FILL — metFORMIN HCL 500 MG TABS: 500 | 30 days supply | Qty: 120 | Fill #0

## 2018-04-24 NOTE — Telephone Encounter (Signed)
New Message   1) Medication(s) Requested (by name): Metformin  2) Pharmacy of Choice: [facility]  3) Special Requests:   Approved medications will be sent to the pharmacy, we will reach out if there is an issue.  Requests made after 3pm may not be addressed until the following business day!  If a patient is unsure of the name of the medication(s) please note and ask patient to call back when they are able to provide all info, do not send to responsible party until all information is available!

## 2018-06-02 ENCOUNTER — Encounter: Payer: Self-pay | Admitting: Family Medicine

## 2018-06-02 ENCOUNTER — Ambulatory Visit: Payer: Self-pay | Attending: Family Medicine | Admitting: Family Medicine

## 2018-06-02 ENCOUNTER — Other Ambulatory Visit: Payer: Self-pay

## 2018-06-02 DIAGNOSIS — E1169 Type 2 diabetes mellitus with other specified complication: Secondary | ICD-10-CM

## 2018-06-02 DIAGNOSIS — I10 Essential (primary) hypertension: Secondary | ICD-10-CM

## 2018-06-02 DIAGNOSIS — E785 Hyperlipidemia, unspecified: Secondary | ICD-10-CM

## 2018-06-02 DIAGNOSIS — Z79899 Other long term (current) drug therapy: Secondary | ICD-10-CM

## 2018-06-02 DIAGNOSIS — E1165 Type 2 diabetes mellitus with hyperglycemia: Secondary | ICD-10-CM

## 2018-06-02 MED ORDER — ATORVASTATIN CALCIUM 10 MG PO TABS: 10.0000 mg | ORAL_TABLET | Freq: Every day | ORAL | 1 refills | Status: DC

## 2018-06-02 MED ORDER — GLUCOSE BLOOD VI STRP: ORAL_STRIP | 1 refills | Status: DC

## 2018-06-02 MED ORDER — LOSARTAN POTASSIUM 25 MG PO TABS: 25.0000 mg | ORAL_TABLET | Freq: Every day | ORAL | 1 refills | Status: DC

## 2018-06-02 MED ORDER — TRUEPLUS LANCETS 28G MISC: 1 refills | Status: DC

## 2018-06-02 MED ORDER — METFORMIN HCL 500 MG PO TABS: 1000.0000 mg | ORAL_TABLET | Freq: Two times a day (BID) | ORAL | 1 refills | Status: DC
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
## 2018-06-02 NOTE — Progress Notes (Signed)
Medication refill  Last night was cbg 200

## 2018-06-09 MED FILL — TRUEplus LANCETS 28G MISC: 33 days supply | Qty: 100 | Fill #0

## 2018-06-09 MED FILL — LOSARTAN POTASSIUM 25 MG TA: 25 | 30 days supply | Qty: 30 | Fill #0

## 2018-06-09 MED FILL — metFORMIN HCL 500 MG TABS: 500 | 30 days supply | Qty: 120 | Fill #0

## 2018-06-09 MED FILL — TRUE METRIX TEST STRIP: 33 days supply | Qty: 100 | Fill #0

## 2018-06-11 ENCOUNTER — Encounter: Payer: Self-pay | Admitting: Family Medicine

## 2018-07-21 MED FILL — LOSARTAN POTASSIUM 25 MG TA: 25 | 30 days supply | Qty: 30 | Fill #1

## 2018-07-21 MED FILL — metFORMIN HCL 500 MG TABS: 500 | 30 days supply | Qty: 120 | Fill #1

## 2018-08-10 ENCOUNTER — Telehealth: Payer: Self-pay | Admitting: Family Medicine

## 2018-09-14 MED FILL — metFORMIN HCL 500 MG TABS: 500 | 30 days supply | Qty: 120 | Fill #2

## 2018-11-03 MED FILL — metFORMIN HCL 500 MG TABS: 500 | 30 days supply | Qty: 120 | Fill #3

## 2018-12-13 MED FILL — metFORMIN HCL 500 MG TABS: 500 | 30 days supply | Qty: 120 | Fill #4

## 2019-02-05 ENCOUNTER — Encounter: Payer: Self-pay | Admitting: Nurse Practitioner

## 2019-02-05 ENCOUNTER — Other Ambulatory Visit: Payer: Self-pay

## 2019-02-05 ENCOUNTER — Ambulatory Visit: Payer: Self-pay | Attending: Nurse Practitioner | Admitting: Nurse Practitioner

## 2019-02-05 DIAGNOSIS — K219 Gastro-esophageal reflux disease without esophagitis: Secondary | ICD-10-CM

## 2019-02-05 DIAGNOSIS — F411 Generalized anxiety disorder: Secondary | ICD-10-CM

## 2019-02-05 MED ORDER — OMEPRAZOLE 20 MG PO CPDR: 20.0000 mg | DELAYED_RELEASE_CAPSULE | Freq: Every day | ORAL | 3 refills | Status: DC

## 2019-02-05 MED ORDER — HYDROXYZINE HCL 25 MG PO TABS: 25.0000 mg | ORAL_TABLET | Freq: Three times a day (TID) | ORAL | 1 refills | Status: DC | PRN

## 2019-02-05 MED FILL — OMEPRAZOLE 20 MG CAP: 20 | 30 days supply | Qty: 30 | Fill #0

## 2019-02-05 MED FILL — hydrOXYzine HCL 25 MG TABS: 25 | 20 days supply | Qty: 60 | Fill #0
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
## 2019-02-13 MED FILL — metFORMIN HCL 500 MG TABS: 500 | 30 days supply | Qty: 120 | Fill #5

## 2019-02-28 ENCOUNTER — Ambulatory Visit: Payer: Self-pay | Admitting: Family Medicine

## 2019-03-01 ENCOUNTER — Ambulatory Visit: Payer: Self-pay | Attending: Family Medicine | Admitting: Family Medicine

## 2019-03-01 ENCOUNTER — Encounter: Payer: Self-pay | Admitting: Family Medicine

## 2019-03-01 ENCOUNTER — Other Ambulatory Visit: Payer: Self-pay

## 2019-03-01 VITALS — BP 133/92 | HR 109 | Temp 98.0°F | Ht 68.0 in | Wt 214.4 lb

## 2019-03-01 DIAGNOSIS — E785 Hyperlipidemia, unspecified: Secondary | ICD-10-CM

## 2019-03-01 DIAGNOSIS — I10 Essential (primary) hypertension: Secondary | ICD-10-CM

## 2019-03-01 DIAGNOSIS — E1165 Type 2 diabetes mellitus with hyperglycemia: Secondary | ICD-10-CM

## 2019-03-01 DIAGNOSIS — Z23 Encounter for immunization: Secondary | ICD-10-CM

## 2019-03-01 DIAGNOSIS — K219 Gastro-esophageal reflux disease without esophagitis: Secondary | ICD-10-CM

## 2019-03-01 DIAGNOSIS — F411 Generalized anxiety disorder: Secondary | ICD-10-CM

## 2019-03-01 DIAGNOSIS — Z79899 Other long term (current) drug therapy: Secondary | ICD-10-CM

## 2019-03-01 DIAGNOSIS — E1169 Type 2 diabetes mellitus with other specified complication: Secondary | ICD-10-CM

## 2019-03-01 LAB — GLUCOSE, POCT (MANUAL RESULT ENTRY)
POC Glucose: 317 mg/dL — AB (ref 70–99)
POC Glucose: 314 mg/dL — AB (ref 70–99)

## 2019-03-01 LAB — POCT GLYCOSYLATED HEMOGLOBIN (HGB A1C): Hemoglobin A1C: 11.3 % — AB (ref 4.0–5.6)

## 2019-03-01 MED ORDER — TRUE METRIX BLOOD GLUCOSE TEST VI STRP: ORAL_STRIP | 1 refills | Status: DC

## 2019-03-01 MED ORDER — HYDROXYZINE HCL 25 MG PO TABS: ORAL_TABLET | ORAL | 3 refills | Status: DC

## 2019-03-01 MED ORDER — OMEPRAZOLE 20 MG PO CPDR: 20.0000 mg | DELAYED_RELEASE_CAPSULE | Freq: Two times a day (BID) | ORAL | 3 refills | Status: DC

## 2019-03-01 MED ORDER — INSULIN ASPART 100 UNIT/ML ~~LOC~~ SOLN
6.0000 [IU] | Freq: Once | SUBCUTANEOUS | Status: AC
  Administered 2019-03-01: 14:00:00 6 [IU] via SUBCUTANEOUS

## 2019-03-01 MED ORDER — METFORMIN HCL 500 MG PO TABS: 1000.0000 mg | ORAL_TABLET | Freq: Two times a day (BID) | ORAL | 1 refills | Status: DC

## 2019-03-01 MED ORDER — ATORVASTATIN CALCIUM 10 MG PO TABS: 10.0000 mg | ORAL_TABLET | Freq: Every day | ORAL | 1 refills | Status: DC

## 2019-03-01 MED ORDER — LOSARTAN POTASSIUM 25 MG PO TABS: 25.0000 mg | ORAL_TABLET | Freq: Every day | ORAL | 1 refills | Status: DC

## 2019-03-01 MED ORDER — GLIMEPIRIDE 4 MG PO TABS: ORAL_TABLET | ORAL | 1 refills | Status: DC

## 2019-03-01 MED FILL — hydrOXYzine HCL 25 MG TABS: 25 | 25 days supply | Qty: 120 | Fill #0

## 2019-03-01 MED FILL — TRUE METRIX TEST STRIP: 30 days supply | Qty: 100 | Fill #0

## 2019-03-01 MED FILL — ATORVASTATIN 10 MG TABLET: 10 | 30 days supply | Qty: 30 | Fill #0

## 2019-03-01 MED FILL — LOSARTAN POTASSIUM 25 MG TA: 25 | 30 days supply | Qty: 30 | Fill #0

## 2019-03-01 MED FILL — GLIMEPIRIDE 4 MG TABS: 4 | 30 days supply | Qty: 25 | Fill #0

## 2019-03-01 NOTE — Patient Instructions (Addendum)
Diabetes Basics  Diabetes (diabetes mellitus) is a long-term (chronic) disease. It occurs when the body does not properly use sugar (glucose) that is released from food after you eat. Diabetes may be caused by one or both of these problems:  Your pancreas does not make enough of a hormone called insulin.  Your body does not react in a normal way to insulin that it makes. Insulin lets sugars (glucose) go into cells in your body. This gives you energy. If you have diabetes, sugars cannot get into cells. This causes high blood sugar (hyperglycemia). Follow these instructions at home: How is diabetes treated? You may need to take insulin or other diabetes medicines daily to keep your blood sugar in balance. Take your diabetes medicines every day as told by your doctor. List your diabetes medicines here: Diabetes medicines  Name of medicine: ______________________________ ? Amount (dose): _______________ Time (a.m./p.m.): _______________ Notes: ___________________________________  Name of medicine: ______________________________ ? Amount (dose): _______________ Time (a.m./p.m.): _______________ Notes: ___________________________________  Name of medicine: ______________________________ ? Amount (dose): _______________ Time (a.m./p.m.): _______________ Notes: ___________________________________ If you use insulin, you will learn how to give yourself insulin by injection. You may need to adjust the amount based on the food that you eat. List the types of insulin you use here: Insulin  Insulin type: ______________________________ ? Amount (dose): _______________ Time (a.m./p.m.): _______________ Notes: ___________________________________  Insulin type: ______________________________ ? Amount (dose): _______________ Time (a.m./p.m.): _______________ Notes: ___________________________________  Insulin type: ______________________________ ? Amount (dose): _______________ Time (a.m./p.m.):  _______________ Notes: ___________________________________  Insulin type: ______________________________ ? Amount (dose): _______________ Time (a.m./p.m.): _______________ Notes: ___________________________________  Insulin type: ______________________________ ? Amount (dose): _______________ Time (a.m./p.m.): _______________ Notes: ___________________________________ How do I manage my blood sugar?  Check your blood sugar levels using a blood glucose monitor as directed by your doctor. Your doctor will set treatment goals for you. Generally, you should have these blood sugar levels:  Before meals (preprandial): 80-130 mg/dL (4.4-7.2 mmol/L).  After meals (postprandial): below 180 mg/dL (10 mmol/L).  A1c level: less than 7%. Write down the times that you will check your blood sugar levels: Blood sugar checks  Time: _______________ Notes: ___________________________________  Time: _______________ Notes: ___________________________________  Time: _______________ Notes: ___________________________________  Time: _______________ Notes: ___________________________________  Time: _______________ Notes: ___________________________________  Time: _______________ Notes: ___________________________________  What do I need to know about low blood sugar? Low blood sugar is called hypoglycemia. This is when blood sugar is at or below 70 mg/dL (3.9 mmol/L). Symptoms may include:  Feeling: ? Hungry. ? Worried or nervous (anxious). ? Sweaty and clammy. ? Confused. ? Dizzy. ? Sleepy. ? Sick to your stomach (nauseous).  Having: ? A fast heartbeat. ? A headache. ? A change in your vision. ? Tingling or no feeling (numbness) around the mouth, lips, or tongue. ? Jerky movements that you cannot control (seizure).  Having trouble with: ? Moving (coordination). ? Sleeping. ? Passing out (fainting). ? Getting upset easily (irritability). Treating low blood sugar To treat low blood  sugar, eat or drink something sugary right away. If you can think clearly and swallow safely, follow the 15:15 rule:  Take 15 grams of a fast-acting carb (carbohydrate). Talk with your doctor about how much you should take.  Some fast-acting carbs are: ? Sugar tablets (glucose pills). Take 3-4 glucose pills. ? 6-8 pieces of hard candy. ? 4-6 oz (120-150 mL) of fruit juice. ? 4-6 oz (120-150 mL) of regular (not diet) soda. ? 1 Tbsp (15 mL) honey or sugar.    Check your blood sugar 15 minutes after you take the carb.  If your blood sugar is still at or below 70 mg/dL (3.9 mmol/L), take 15 grams of a carb again.  If your blood sugar does not go above 70 mg/dL (3.9 mmol/L) after 3 tries, get help right away.  After your blood sugar goes back to normal, eat a meal or a snack within 1 hour. Treating very low blood sugar If your blood sugar is at or below 54 mg/dL (3 mmol/L), you have very low blood sugar (severe hypoglycemia). This is an emergency. Do not wait to see if the symptoms will go away. Get medical help right away. Call your local emergency services (911 in the [place] Do not drive yourself to the hospital. Questions to ask your health care provider  Do I need to meet with a diabetes educator?  What equipment will I need to care for myself at home?  What diabetes medicines do I need? When should I take them?  How often do I need to check my blood sugar?  What number can I call if I have questions?  When is my next doctor's visit?  Where can I find a support group for people with diabetes? Where to find more information  American Diabetes Association: www.diabetes.org  American Association of Diabetes Educators: www.diabeteseducator.org/patient-resources Contact a doctor if:  Your blood sugar is at or above 240 mg/dL (44.3 mmol/L) for 2 days in a row.  You have been sick or have had a fever for 2 days or more, and you are not getting better.  You have any of these  problems for more than 6 hours: ? You cannot eat or drink. ? You feel sick to your stomach (nauseous). ? You throw up (vomit). ? You have watery poop (diarrhea). Get help right away if:  Your blood sugar is lower than 54 mg/dL (3 mmol/L).  You get confused.  You have trouble: ? Thinking clearly. ? Breathing. Summary  Diabetes (diabetes mellitus) is a long-term (chronic) disease. It occurs when the body does not properly use sugar (glucose) that is released from food after digestion.  Take insulin and diabetes medicines as told.  Check your blood sugar every day, as often as told.  Keep all follow-up visits as told by your doctor. This is important. This information is not intended to replace advice given to you by your health care provider. Make sure you discuss any questions you have with your health care provider. Document Revised: 10/25/2018 Document Reviewed: 05/06/2017 Elsevier Patient Education  2020 [employer]  Generalized Anxiety Disorder, Adult Generalized anxiety disorder (GAD) is a mental health disorder. People with this condition constantly worry about everyday events. Unlike normal anxiety, worry related to GAD is not triggered by a specific event. These worries also do not fade or get better with time. GAD interferes with life functions, including relationships, work, and school. GAD can vary from mild to severe. People with severe GAD can have intense waves of anxiety with physical symptoms (panic attacks). What are the causes? The exact cause of GAD is not known. What increases the risk? This condition is more likely to develop in:  Women.  People who have a family history of anxiety disorders.  People who are very shy.  People who experience very stressful life events, such as the death of a loved one.  People who have a very stressful family environment. What are the signs or symptoms? People with GAD often worry excessively  about many things in  their lives, such as their health and family. They may also be overly concerned about:  Doing well at work.  Being on time.  Natural disasters.  Friendships. Physical symptoms of GAD include:  Fatigue.  Muscle tension or having muscle twitches.  Trembling or feeling shaky.  Being easily startled.  Feeling like your heart is pounding or racing.  Feeling out of breath or like you cannot take a deep breath.  Having trouble falling asleep or staying asleep.  Sweating.  Nausea, diarrhea, or irritable bowel syndrome (IBS).  Headaches.  Trouble concentrating or remembering facts.  Restlessness.  Irritability. How is this diagnosed? Your health care provider can diagnose GAD based on your symptoms and medical history. You will also have a physical exam. The health care provider will ask specific questions about your symptoms, including how severe they are, when they started, and if they come and go. Your health care provider may ask you about your use of alcohol or drugs, including prescription medicines. Your health care provider may refer you to a mental health specialist for further evaluation. Your health care provider will do a thorough examination and may perform additional tests to rule out other possible causes of your symptoms. To be diagnosed with GAD, a person must have anxiety that:  Is out of his or her control.  Affects several different aspects of his or her life, such as work and relationships.  Causes distress that makes him or her unable to take part in normal activities.  Includes at least three physical symptoms of GAD, such as restlessness, fatigue, trouble concentrating, irritability, muscle tension, or sleep problems. Before your health care provider can confirm a diagnosis of GAD, these symptoms must be present more days than they are not, and they must last for six months or longer. How is this treated? The following therapies are usually used to  treat GAD:  Medicine. Antidepressant medicine is usually prescribed for long-term daily control. Antianxiety medicines may be added in severe cases, especially when panic attacks occur.  Talk therapy (psychotherapy). Certain types of talk therapy can be helpful in treating GAD by providing support, education, and guidance. Options include: ? Cognitive behavioral therapy (CBT). People learn coping skills and techniques to ease their anxiety. They learn to identify unrealistic or negative thoughts and behaviors and to replace them with positive ones. ? Acceptance and commitment therapy (ACT). This treatment teaches people how to be mindful as a way to cope with unwanted thoughts and feelings. ? Biofeedback. This process trains you to manage your body's response (physiological response) through breathing techniques and relaxation methods. You will work with a therapist while machines are used to monitor your physical symptoms.  Stress management techniques. These include yoga, meditation, and exercise. A mental health specialist can help determine which treatment is best for you. Some people see improvement with one type of therapy. However, other people require a combination of therapies. Follow these instructions at home:  Take over-the-counter and prescription medicines only as told by your health care provider.  Try to maintain a normal routine.  Try to anticipate stressful situations and allow extra time to manage them.  Practice any stress management or self-calming techniques as taught by your health care provider.  Do not punish yourself for setbacks or for not making progress.  Try to recognize your accomplishments, even if they are small.  Keep all follow-up visits as told by your health care provider. This is important. Contact a  health care provider if:  Your symptoms do not get better.  Your symptoms get worse.  You have signs of depression, such as: ? A persistently sad,  cranky, or irritable mood. ? Loss of enjoyment in activities that used to bring you joy. ? Change in weight or eating. ? Changes in sleeping habits. ? Avoiding friends or family members. ? Loss of energy for normal tasks. ? Feelings of guilt or worthlessness. Get help right away if:  You have serious thoughts about hurting yourself or others. If you ever feel like you may hurt yourself or others, or have thoughts about taking your own life, get help right away. You can go to your nearest emergency department or call:  Your local emergency services (911 in the [place]  A suicide crisis helpline, such as the National Suicide Prevention Lifeline at 850-774-3882. This is open 24 hours a day. Summary  Generalized anxiety disorder (GAD) is a mental health disorder that involves worry that is not triggered by a specific event.  People with GAD often worry excessively about many things in their lives, such as their health and family.  GAD may cause physical symptoms such as restlessness, trouble concentrating, sleep problems, frequent sweating, nausea, diarrhea, headaches, and trembling or muscle twitching.  A mental health specialist can help determine which treatment is best for you. Some people see improvement with one type of therapy. However, other people require a combination of therapies. This information is not intended to replace advice given to you by your health care provider. Make sure you discuss any questions you have with your health care provider. Document Revised: 01/14/2017 Document Reviewed: 12/23/2015 Elsevier Patient Education  2020 [employer].
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
## 2019-03-01 NOTE — Progress Notes (Signed)
Pt. Is here for a follow up.   Pt. Stated he thinks the medication Losartan makes his chest hurt.

## 2019-03-02 ENCOUNTER — Other Ambulatory Visit: Payer: Self-pay | Admitting: Family Medicine

## 2019-03-02 DIAGNOSIS — E785 Hyperlipidemia, unspecified: Secondary | ICD-10-CM

## 2019-03-02 DIAGNOSIS — E1169 Type 2 diabetes mellitus with other specified complication: Secondary | ICD-10-CM

## 2019-03-02 LAB — COMPREHENSIVE METABOLIC PANEL WITH GFR
ALT: 36 IU/L (ref 0–44)
AST: 33 IU/L (ref 0–40)
Albumin/Globulin Ratio: 1.7 (ref 1.2–2.2)
Albumin: 4.7 g/dL (ref 4.0–5.0)
Alkaline Phosphatase: 78 IU/L (ref 39–117)
BUN/Creatinine Ratio: 14 (ref 9–20)
BUN: 14 mg/dL (ref 6–24)
Bilirubin Total: 0.7 mg/dL (ref 0.0–1.2)
CO2: 25 mmol/L (ref 20–29)
Calcium: 10.2 mg/dL (ref 8.7–10.2)
Chloride: 94 mmol/L — ABNORMAL LOW (ref 96–106)
Creatinine, Ser: 0.99 mg/dL (ref 0.76–1.27)
GFR calc Af Amer: 110 mL/min/1.73
GFR calc non Af Amer: 95 mL/min/1.73
Globulin, Total: 2.8 g/dL (ref 1.5–4.5)
Glucose: 312 mg/dL — ABNORMAL HIGH (ref 65–99)
Potassium: 4.7 mmol/L (ref 3.5–5.2)
Sodium: 135 mmol/L (ref 134–144)
Total Protein: 7.5 g/dL (ref 6.0–8.5)

## 2019-03-02 LAB — LIPID PANEL
Chol/HDL Ratio: 4 ratio (ref 0.0–5.0)
Cholesterol, Total: 234 mg/dL — ABNORMAL HIGH (ref 100–199)
HDL: 58 mg/dL
LDL Chol Calc (NIH): 137 mg/dL — ABNORMAL HIGH (ref 0–99)
Triglycerides: 217 mg/dL — ABNORMAL HIGH (ref 0–149)
VLDL Cholesterol Cal: 39 mg/dL (ref 5–40)

## 2019-03-02 LAB — TSH: TSH: 1.99 u[IU]/mL (ref 0.450–4.500)

## 2019-03-02 LAB — MICROALBUMIN / CREATININE URINE RATIO
Creatinine, Urine: 300.1 mg/dL
Microalb/Creat Ratio: 36 mg/g{creat} — ABNORMAL HIGH (ref 0–29)
Microalbumin, Urine: 107.9 ug/mL

## 2019-03-02 MED ORDER — ROSUVASTATIN CALCIUM 20 MG PO TABS: 20.0000 mg | ORAL_TABLET | Freq: Every day | ORAL | 5 refills | Status: DC

## 2019-03-05 ENCOUNTER — Telehealth: Payer: Self-pay | Admitting: *Deleted

## 2019-03-05 MED FILL — ROSUVASTATIN CALCIUM 20 MG: 20 | 30 days supply | Qty: 30 | Fill #0

## 2019-03-05 NOTE — Telephone Encounter (Signed)
Medical Assistant left message on patient's home and cell voicemail. Voicemail states to give a call back to Darleene Cumpian with [facility] at [phone].  

## 2019-03-05 NOTE — Telephone Encounter (Signed)
-----   Message from Cain Saupe, MD sent at 03/02/2019 11:54 PM EST ----- On comprehensive metabolic panel, glucose elevated at 312 otherwise normal electrolytes and normal liver enzymes.  Mild increase in microalbumin creatinine ratio which could signal some early damage to the kidneys from the effects of diabetes/hypertension so it is very important that blood sugars and blood pressure are controlled.  Normal thyroid blood test.  Lipid panel with elevated triglycerides at 217 and elevated LDL at 137 with LDL goal of 70 or less as patient is diabetic.  New prescription will be sent to patient's pharmacy for a new cholesterol medication, rosuvastatin 20 mg once daily to help lower the cholesterol and a low-fat diet is encouraged.

## 2019-03-19 MED FILL — metFORMIN HCL 500 MG TABS: 500 | 30 days supply | Qty: 120 | Fill #0

## 2019-03-19 MED FILL — OMEPRAZOLE 20 MG CAP: 20 | 30 days supply | Qty: 30 | Fill #1

## 2019-03-29 MED FILL — GLIMEPIRIDE 4 MG TABS: 4 | 30 days supply | Qty: 30 | Fill #1

## 2019-04-05 ENCOUNTER — Ambulatory Visit: Payer: Self-pay | Admitting: Pharmacist

## 2019-04-26 MED FILL — OMEPRAZOLE 20 MG CAP: 20 | 30 days supply | Qty: 30 | Fill #2

## 2019-04-26 MED FILL — ATORVASTATIN 10 MG TABLET: 10 | 30 days supply | Qty: 30 | Fill #1

## 2019-04-26 MED FILL — GLIMEPIRIDE 4 MG TABS: 4 | 30 days supply | Qty: 30 | Fill #2

## 2019-05-04 MED FILL — metFORMIN HCL 500 MG TABS: 500 | 30 days supply | Qty: 120 | Fill #1

## 2019-05-28 MED FILL — GLIMEPIRIDE 4 MG TABS: 4 | 30 days supply | Qty: 30 | Fill #3

## 2019-05-28 MED FILL — ATORVASTATIN 10 MG TABLET: 10 | 30 days supply | Qty: 30 | Fill #2

## 2019-05-28 MED FILL — OMEPRAZOLE 20 MG CAP: 20 | 30 days supply | Qty: 30 | Fill #3

## 2019-05-31 ENCOUNTER — Ambulatory Visit: Payer: Self-pay | Admitting: Family Medicine

## 2019-06-05 MED FILL — OMEPRAZOLE 20 MG CAP: 20 | 30 days supply | Qty: 30 | Fill #3

## 2019-06-05 MED FILL — GLIMEPIRIDE 4 MG TABS: 4 | 30 days supply | Qty: 30 | Fill #3

## 2019-06-05 MED FILL — ATORVASTATIN 10 MG TABLET: 10 | 30 days supply | Qty: 30 | Fill #2

## 2019-06-13 ENCOUNTER — Emergency Department (HOSPITAL_COMMUNITY)
Admission: EM | Admit: 2019-06-13 | Discharge: 2019-06-13 | Disposition: A | Discharge status: Home / Self Care | Payer: Self-pay | Attending: Emergency Medicine | Admitting: Emergency Medicine

## 2019-06-13 ENCOUNTER — Other Ambulatory Visit: Payer: Self-pay

## 2019-06-13 ENCOUNTER — Encounter (HOSPITAL_COMMUNITY): Payer: Self-pay | Admitting: Emergency Medicine

## 2019-06-13 ENCOUNTER — Emergency Department (HOSPITAL_BASED_OUTPATIENT_CLINIC_OR_DEPARTMENT_OTHER): Payer: Self-pay

## 2019-06-13 ENCOUNTER — Encounter (HOSPITAL_BASED_OUTPATIENT_CLINIC_OR_DEPARTMENT_OTHER): Payer: Self-pay

## 2019-06-13 ENCOUNTER — Emergency Department (HOSPITAL_BASED_OUTPATIENT_CLINIC_OR_DEPARTMENT_OTHER)
Admission: EM | Admit: 2019-06-13 | Discharge: 2019-06-13 | Disposition: A | Discharge status: Home / Self Care | Payer: Self-pay | Attending: Emergency Medicine | Admitting: Emergency Medicine

## 2019-06-13 DIAGNOSIS — Z91041 Radiographic dye allergy status: Secondary | ICD-10-CM | POA: Insufficient documentation

## 2019-06-13 DIAGNOSIS — I10 Essential (primary) hypertension: Secondary | ICD-10-CM | POA: Insufficient documentation

## 2019-06-13 DIAGNOSIS — Z79899 Other long term (current) drug therapy: Secondary | ICD-10-CM | POA: Insufficient documentation

## 2019-06-13 DIAGNOSIS — Z91013 Allergy to seafood: Secondary | ICD-10-CM | POA: Insufficient documentation

## 2019-06-13 DIAGNOSIS — K852 Alcohol induced acute pancreatitis without necrosis or infection: Secondary | ICD-10-CM | POA: Insufficient documentation

## 2019-06-13 DIAGNOSIS — F17210 Nicotine dependence, cigarettes, uncomplicated: Secondary | ICD-10-CM | POA: Insufficient documentation

## 2019-06-13 DIAGNOSIS — R109 Unspecified abdominal pain: Secondary | ICD-10-CM | POA: Insufficient documentation

## 2019-06-13 DIAGNOSIS — Z5321 Procedure and treatment not carried out due to patient leaving prior to being seen by health care provider: Secondary | ICD-10-CM | POA: Insufficient documentation

## 2019-06-13 DIAGNOSIS — Z7984 Long term (current) use of oral hypoglycemic drugs: Secondary | ICD-10-CM | POA: Insufficient documentation

## 2019-06-13 DIAGNOSIS — E119 Type 2 diabetes mellitus without complications: Secondary | ICD-10-CM | POA: Insufficient documentation

## 2019-06-13 DIAGNOSIS — Z91030 Bee allergy status: Secondary | ICD-10-CM | POA: Insufficient documentation

## 2019-06-13 LAB — CBC
HCT: 46.7 % (ref 39.0–52.0)
HCT: 45.1 % (ref 39.0–52.0)
Hemoglobin: 15 g/dL (ref 13.0–17.0)
Hemoglobin: 14.9 g/dL (ref 13.0–17.0)
MCH: 26.5 pg (ref 26.0–34.0)
MCH: 26.3 pg (ref 26.0–34.0)
MCHC: 32.1 g/dL (ref 30.0–36.0)
MCHC: 33 g/dL (ref 30.0–36.0)
MCV: 82.4 fL (ref 80.0–100.0)
MCV: 79.7 fL — ABNORMAL LOW (ref 80.0–100.0)
Platelets: 161 10*3/uL (ref 150–400)
Platelets: 289 10*3/uL (ref 150–400)
RBC: 5.67 MIL/uL (ref 4.22–5.81)
RBC: 5.66 MIL/uL (ref 4.22–5.81)
RDW: 13.5 % (ref 11.5–15.5)
RDW: 13.4 % (ref 11.5–15.5)
WBC: 5.2 10*3/uL (ref 4.0–10.5)
WBC: 6.5 10*3/uL (ref 4.0–10.5)
nRBC: 0 % (ref 0.0–0.2)
nRBC: 0 % (ref 0.0–0.2)

## 2019-06-13 LAB — COMPREHENSIVE METABOLIC PANEL
ALT: 29 U/L (ref 0–44)
AST: 19 U/L (ref 15–41)
Albumin: 4.1 g/dL (ref 3.5–5.0)
Alkaline Phosphatase: 76 U/L (ref 38–126)
Anion gap: 9 (ref 5–15)
BUN: 16 mg/dL (ref 6–20)
CO2: 23 mmol/L (ref 22–32)
Calcium: 9.4 mg/dL (ref 8.9–10.3)
Chloride: 101 mmol/L (ref 98–111)
Creatinine, Ser: 0.74 mg/dL (ref 0.61–1.24)
GFR calc Af Amer: >60 mL/min (ref >60)
GFR calc non Af Amer: >60 mL/min (ref >60)
Glucose, Bld: 249 mg/dL — ABNORMAL HIGH (ref 70–99)
Potassium: 4.1 mmol/L (ref 3.5–5.1)
Sodium: 133 mmol/L — ABNORMAL LOW (ref 135–145)
Total Bilirubin: 0.6 mg/dL (ref 0.3–1.2)
Total Protein: 8.2 g/dL — ABNORMAL HIGH (ref 6.5–8.1)

## 2019-06-13 LAB — URINALYSIS, ROUTINE W REFLEX MICROSCOPIC
Bilirubin Urine: NEGATIVE
Glucose, UA: >=500 mg/dL — AB
Hgb urine dipstick: NEGATIVE
Ketones, ur: 15 mg/dL — AB
Leukocytes,Ua: NEGATIVE
Nitrite: NEGATIVE
Protein, ur: 30 mg/dL — AB
Specific Gravity, Urine: >1.03 — ABNORMAL HIGH (ref 1.005–1.030)
pH: 5.5 (ref 5.0–8.0)

## 2019-06-13 LAB — CBG MONITORING, ED: Glucose-Capillary: 218 mg/dL — ABNORMAL HIGH (ref 70–99)

## 2019-06-13 LAB — URINALYSIS, MICROSCOPIC (REFLEX): WBC, UA: NONE SEEN WBC/hpf (ref 0–5)

## 2019-06-13 LAB — TROPONIN I (HIGH SENSITIVITY): Troponin I (High Sensitivity): 4 ng/L (ref <18)

## 2019-06-13 LAB — LIPASE, BLOOD: Lipase: 253 U/L — ABNORMAL HIGH (ref 11–51)

## 2019-06-13 MED ORDER — ONDANSETRON 4 MG PO TBDP: 4.0000 mg | ORAL_TABLET | Freq: Three times a day (TID) | ORAL | 1 refills | Status: DC | PRN

## 2019-06-13 MED ORDER — SODIUM CHLORIDE 0.9 % IV SOLN: INTRAVENOUS | Status: DC

## 2019-06-13 MED ORDER — DIPHENHYDRAMINE HCL 50 MG/ML IJ SOLN
25.0000 mg | Freq: Once | INTRAMUSCULAR | Status: AC
  Administered 2019-06-13: 25 mg via INTRAVENOUS
  Filled 2019-06-13: qty 1

## 2019-06-13 MED ORDER — IOHEXOL 300 MG/ML  SOLN
100.0000 mL | Freq: Once | INTRAMUSCULAR | Status: AC
  Administered 2019-06-13: 18:00:00 100 mL via INTRAVENOUS

## 2019-06-13 MED ORDER — HYDROMORPHONE HCL 1 MG/ML IJ SOLN
1.0000 mg | Freq: Once | INTRAMUSCULAR | Status: AC
  Administered 2019-06-13: 18:00:00 1 mg via INTRAVENOUS
  Filled 2019-06-13: qty 1

## 2019-06-13 MED ORDER — SODIUM CHLORIDE 0.9% FLUSH: 3.0000 mL | Freq: Once | INTRAVENOUS | Status: DC

## 2019-06-13 MED ORDER — SODIUM CHLORIDE 0.9% FLUSH
3.0000 mL | Freq: Once | INTRAVENOUS | Status: DC
  Filled 2019-06-13: qty 3

## 2019-06-13 MED ORDER — HYDROCODONE-ACETAMINOPHEN 5-325 MG PO TABS: 1.0000 | ORAL_TABLET | Freq: Four times a day (QID) | ORAL | 0 refills | Status: DC | PRN

## 2019-06-13 MED ORDER — ONDANSETRON HCL 4 MG/2ML IJ SOLN
4.0000 mg | Freq: Once | INTRAMUSCULAR | Status: AC
  Administered 2019-06-13: 18:00:00 4 mg via INTRAVENOUS
  Filled 2019-06-13: qty 2

## 2019-06-13 NOTE — ED Notes (Signed)
Pt c/o of itching after iv contrast  md notified  Received orders for benadryl
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
## 2019-06-13 NOTE — ED Triage Notes (Signed)
Pt c/o left side abd pain and diarrhea x  1 month-left side CP x 2 days-NAD-steady gait

## 2019-06-13 NOTE — ED Triage Notes (Signed)
Pt c/o left side abd pains for a month. Hx diverticulitis. Reports either has issues with constipation or diarrhea.

## 2019-06-13 NOTE — Discharge Instructions (Signed)
Work note provided to be out of work for the next 3 days.  Avoid all alcohol.  Clear liquid diet for the next 2 days then bland diet.  Make an appointment to follow-up with gastroenterology or your primary care doctor.  Take the hydrocodone as needed for pain.  Take the Zofran as needed for nausea.

## 2019-06-19 MED FILL — METFORMIN HCL 500 MG TABS: 500 | 30 days supply | Qty: 120 | Fill #2

## 2019-06-22 ENCOUNTER — Ambulatory Visit: Payer: Self-pay | Admitting: Family Medicine

## 2019-07-06 MED FILL — ?ATORVASTATIN CALCIUM 10MG: 10 | 30 days supply | Qty: 30 | Fill #3

## 2019-07-06 MED FILL — GLIMEPIRIDE 4 MG TABS: 4 | 30 days supply | Qty: 30 | Fill #4

## 2019-08-09 MED FILL — GLIMEPIRIDE 4 MG TABS: 4 | 30 days supply | Qty: 30 | Fill #5

## 2019-08-09 MED FILL — METFORMIN HCL 500 MG TABS: 500 | 30 days supply | Qty: 120 | Fill #3

## 2019-08-09 MED FILL — hydrOXYzine HCL 25 MG TABS: 25 | 25 days supply | Qty: 120 | Fill #1

## 2019-08-30 ENCOUNTER — Other Ambulatory Visit: Payer: Self-pay | Admitting: Family Medicine

## 2019-08-30 DIAGNOSIS — E1165 Type 2 diabetes mellitus with hyperglycemia: Secondary | ICD-10-CM

## 2019-08-30 DIAGNOSIS — F411 Generalized anxiety disorder: Secondary | ICD-10-CM

## 2019-08-30 MED ORDER — GLIMEPIRIDE 4 MG PO TABS: ORAL_TABLET | ORAL | 0 refills | Status: DC

## 2019-08-30 MED ORDER — HYDROXYZINE HCL 25 MG PO TABS: ORAL_TABLET | ORAL | 1 refills | Status: DC

## 2019-08-30 NOTE — Telephone Encounter (Signed)
Pt called in to request a refill for   glimepiride (AMARYL) 4 MG tablet  hydrOXYzine (ATARAX/VISTARIL) 25 MG tablet   Pharmacy:  [facility] - [place], [place] - [address] Phone:  [phone]  Fax:  [phone]

## 2019-08-30 NOTE — Telephone Encounter (Signed)
Courtesy refill provided. Patient has appt scheduled on 10/09/19.

## 2019-08-31 MED FILL — GLIMEPIRIDE 4 MG TABS: 4 | 33 days supply | Qty: 30 | Fill #0

## 2019-08-31 MED FILL — hydrOXYzine HCL 25 MG TABS: 25 | 30 days supply | Qty: 120 | Fill #0

## 2019-09-17 ENCOUNTER — Other Ambulatory Visit: Payer: Self-pay | Admitting: Family Medicine

## 2019-09-17 DIAGNOSIS — E1165 Type 2 diabetes mellitus with hyperglycemia: Secondary | ICD-10-CM

## 2019-09-17 MED FILL — GLIMEPIRIDE 4 MG TABS: 4 | 30 days supply | Qty: 30 | Fill #0

## 2019-09-26 MED FILL — METFORMIN HCL 500 MG TABS: 500 | 30 days supply | Qty: 120 | Fill #4

## 2019-09-26 MED FILL — ?ATORVASTATIN 10 MG TABLET: 10 | 30 days supply | Qty: 30 | Fill #4

## 2019-10-10 ENCOUNTER — Ambulatory Visit: Payer: Self-pay | Attending: Nurse Practitioner | Admitting: Nurse Practitioner

## 2019-10-10 ENCOUNTER — Other Ambulatory Visit: Payer: Self-pay

## 2019-10-10 ENCOUNTER — Other Ambulatory Visit: Payer: Self-pay | Admitting: Nurse Practitioner

## 2019-10-10 DIAGNOSIS — E785 Hyperlipidemia, unspecified: Secondary | ICD-10-CM

## 2019-10-10 DIAGNOSIS — E1165 Type 2 diabetes mellitus with hyperglycemia: Secondary | ICD-10-CM

## 2019-10-10 DIAGNOSIS — E1169 Type 2 diabetes mellitus with other specified complication: Secondary | ICD-10-CM

## 2019-10-10 DIAGNOSIS — I10 Essential (primary) hypertension: Secondary | ICD-10-CM

## 2019-10-10 MED ORDER — METFORMIN HCL 500 MG PO TABS: 1000.0000 mg | ORAL_TABLET | Freq: Two times a day (BID) | ORAL | 1 refills | Status: DC

## 2019-10-10 MED ORDER — GLIMEPIRIDE 4 MG PO TABS: ORAL_TABLET | ORAL | 1 refills | Status: DC

## 2019-10-10 MED ORDER — AMLODIPINE BESYLATE 10 MG PO TABS: 10.0000 mg | ORAL_TABLET | Freq: Every day | ORAL | 3 refills | Status: DC

## 2019-10-10 MED FILL — AMLODIPINE BESYLATE 10 MG T: 10 | 30 days supply | Qty: 30 | Fill #0
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
## 2019-10-11 ENCOUNTER — Ambulatory Visit: Payer: Self-pay | Attending: Family Medicine

## 2019-10-11 ENCOUNTER — Other Ambulatory Visit: Payer: Self-pay

## 2019-10-11 ENCOUNTER — Other Ambulatory Visit: Payer: Self-pay | Admitting: Nurse Practitioner

## 2019-10-11 DIAGNOSIS — E1165 Type 2 diabetes mellitus with hyperglycemia: Secondary | ICD-10-CM

## 2019-10-11 DIAGNOSIS — Z114 Encounter for screening for human immunodeficiency virus [HIV]: Secondary | ICD-10-CM

## 2019-10-11 DIAGNOSIS — Z1159 Encounter for screening for other viral diseases: Secondary | ICD-10-CM

## 2019-10-11 DIAGNOSIS — E785 Hyperlipidemia, unspecified: Secondary | ICD-10-CM

## 2019-10-12 LAB — CMP14+EGFR
ALT: 35 IU/L (ref 0–44)
AST: 22 IU/L (ref 0–40)
Albumin/Globulin Ratio: 1.5 (ref 1.2–2.2)
Albumin: 4.4 g/dL (ref 4.0–5.0)
Alkaline Phosphatase: 78 IU/L (ref 48–121)
BUN/Creatinine Ratio: 17 (ref 9–20)
BUN: 16 mg/dL (ref 6–24)
Bilirubin Total: 0.7 mg/dL (ref 0.0–1.2)
CO2: 21 mmol/L (ref 20–29)
Calcium: 9.6 mg/dL (ref 8.7–10.2)
Chloride: 97 mmol/L (ref 96–106)
Creatinine, Ser: 0.92 mg/dL (ref 0.76–1.27)
GFR calc Af Amer: 119 mL/min/{1.73_m2} (ref >59)
GFR calc non Af Amer: 103 mL/min/{1.73_m2} (ref >59)
Globulin, Total: 2.9 g/dL (ref 1.5–4.5)
Glucose: 276 mg/dL — ABNORMAL HIGH (ref 65–99)
Potassium: 4.4 mmol/L (ref 3.5–5.2)
Sodium: 135 mmol/L (ref 134–144)
Total Protein: 7.3 g/dL (ref 6.0–8.5)

## 2019-10-12 LAB — HEMOGLOBIN A1C
Est. average glucose Bld gHb Est-mCnc: 286 mg/dL
Hgb A1c MFr Bld: 11.6 % — ABNORMAL HIGH (ref 4.8–5.6)

## 2019-10-12 LAB — LIPID PANEL
Chol/HDL Ratio: 4.7 ratio (ref 0.0–5.0)
Cholesterol, Total: 182 mg/dL (ref 100–199)
HDL: 39 mg/dL — ABNORMAL LOW (ref >39)
LDL Chol Calc (NIH): 110 mg/dL — ABNORMAL HIGH (ref 0–99)
Triglycerides: 186 mg/dL — ABNORMAL HIGH (ref 0–149)
VLDL Cholesterol Cal: 33 mg/dL (ref 5–40)

## 2019-10-12 LAB — HEPATITIS C ANTIBODY: Hep C Virus Ab: 0.2 s/co ratio (ref 0.0–0.9)

## 2019-10-12 LAB — HIV ANTIBODY (ROUTINE TESTING W REFLEX): HIV Screen 4th Generation wRfx: NONREACTIVE

## 2019-10-15 ENCOUNTER — Other Ambulatory Visit: Payer: Self-pay | Admitting: Nurse Practitioner

## 2019-10-15 ENCOUNTER — Encounter: Payer: Self-pay | Admitting: Nurse Practitioner

## 2019-10-15 MED ORDER — ATORVASTATIN CALCIUM 40 MG PO TABS: 40.0000 mg | ORAL_TABLET | Freq: Every day | ORAL | 1 refills | Status: DC

## 2019-10-15 MED ORDER — BD PEN NEEDLE MINI U/F 31G X 5 MM MISC: 3 refills | Status: DC

## 2019-10-15 MED ORDER — INSULIN GLARGINE 100 UNIT/ML SOLOSTAR PEN: 20.0000 [IU] | PEN_INJECTOR | Freq: Every day | SUBCUTANEOUS | 11 refills | Status: DC

## 2019-10-15 MED FILL — ?BASAGLAR 100 UNITS/ML KWPE: 100 | 30 days supply | Qty: 6 | Fill #0

## 2019-10-15 MED FILL — ?ATORVASTATIN 40MG TABLET: 40 | 30 days supply | Qty: 30 | Fill #0

## 2019-10-15 MED FILL — TRUEPLUS 5-BEVEL PEN NEEDLE: 31G X 5 MM | 90 days supply | Qty: 100 | Fill #0

## 2019-10-29 ENCOUNTER — Other Ambulatory Visit: Payer: Self-pay | Admitting: Family Medicine

## 2019-10-29 DIAGNOSIS — E1165 Type 2 diabetes mellitus with hyperglycemia: Secondary | ICD-10-CM

## 2019-10-29 NOTE — Telephone Encounter (Signed)
Medication Refill - Medication: glimepiride (AMARYL) 4 MG tablet    Preferred Pharmacy (with phone number or street name):  [facility] - [place], [place] - [address] Phone:  [phone]  Fax:  [phone]       Agent: Please be advised that RX refills may take up to 3 business days. We ask that you follow-up with your pharmacy.

## 2019-10-31 MED FILL — hydrOXYzine HCL 25 MG TABS: 25 | 25 days supply | Qty: 120 | Fill #2

## 2019-10-31 MED FILL — ?METFORMIN HCL 500MG TABL: 500 | 30 days supply | Qty: 120 | Fill #0

## 2019-10-31 MED FILL — GLIMEPIRIDE 4 MG TABS: 4 | 30 days supply | Qty: 30 | Fill #0

## 2019-11-03 ENCOUNTER — Other Ambulatory Visit: Payer: Self-pay | Admitting: Family Medicine

## 2019-11-03 DIAGNOSIS — E1165 Type 2 diabetes mellitus with hyperglycemia: Secondary | ICD-10-CM

## 2019-11-03 NOTE — Telephone Encounter (Signed)
Requested Prescriptions  Pending Prescriptions Disp Refills  . Blood Glucose Monitoring Suppl (TRUE METRIX AIR GLUCOSE METER) w/Device KIT [Pharmacy Med Name: TRUE METRIX BLOOD GLUCOSE EACH] 1 kit 0    Sig: USE AS DIRECTED THREE TIMES DAILY     Endocrinology: Diabetes - Testing Supplies Passed - 11/03/2019 12:59 PM      Passed - Valid encounter within last 12 months    Recent Outpatient Visits          3 weeks ago Essential hypertension   [place], [place] W, NP   8 months ago Type 2 diabetes mellitus with hyperglycemia, without long-term current use of insulin ([place])   [place], MD   9 months ago GAD (generalized anxiety disorder)   [place], Vernia Buff, NP   1 year ago Essential hypertension   [place], MD   1 year ago Essential hypertension   [place], RPH-CPP      Future Appointments            In 6 days Gildardo Pounds, NP [place]

## 2019-11-05 ENCOUNTER — Other Ambulatory Visit: Payer: Self-pay | Admitting: Pharmacist

## 2019-11-05 DIAGNOSIS — E1165 Type 2 diabetes mellitus with hyperglycemia: Secondary | ICD-10-CM

## 2019-11-05 MED ORDER — TRUE METRIX BLOOD GLUCOSE TEST VI STRP: ORAL_STRIP | 6 refills | Status: DC

## 2019-11-05 MED ORDER — TRUEPLUS LANCETS 28G MISC: 6 refills | Status: DC

## 2019-11-05 MED FILL — TRUE METRIX TEST STRIP: 90 days supply | Qty: 100 | Fill #0

## 2019-11-05 MED FILL — !TRUE METRIX BLOOD GLUCOSE: 1 days supply | Qty: 1 | Fill #0

## 2019-11-05 MED FILL — TRUEplus LANCETS 28G MISC: 34 days supply | Qty: 100 | Fill #0

## 2019-11-06 ENCOUNTER — Emergency Department (HOSPITAL_COMMUNITY): Payer: Self-pay

## 2019-11-06 ENCOUNTER — Emergency Department (HOSPITAL_COMMUNITY)
Admission: EM | Admit: 2019-11-06 | Discharge: 2019-11-06 | Disposition: A | Discharge status: Home / Self Care | Payer: Self-pay | Attending: Emergency Medicine | Admitting: Emergency Medicine

## 2019-11-06 ENCOUNTER — Encounter (HOSPITAL_COMMUNITY): Payer: Self-pay

## 2019-11-06 ENCOUNTER — Other Ambulatory Visit: Payer: Self-pay

## 2019-11-06 DIAGNOSIS — Z79899 Other long term (current) drug therapy: Secondary | ICD-10-CM | POA: Insufficient documentation

## 2019-11-06 DIAGNOSIS — M25521 Pain in right elbow: Secondary | ICD-10-CM | POA: Insufficient documentation

## 2019-11-06 DIAGNOSIS — Z794 Long term (current) use of insulin: Secondary | ICD-10-CM | POA: Insufficient documentation

## 2019-11-06 DIAGNOSIS — F17210 Nicotine dependence, cigarettes, uncomplicated: Secondary | ICD-10-CM | POA: Insufficient documentation

## 2019-11-06 DIAGNOSIS — E119 Type 2 diabetes mellitus without complications: Secondary | ICD-10-CM | POA: Insufficient documentation

## 2019-11-06 DIAGNOSIS — I10 Essential (primary) hypertension: Secondary | ICD-10-CM | POA: Insufficient documentation

## 2019-11-06 MED ORDER — CYCLOBENZAPRINE HCL 10 MG PO TABS: 10.0000 mg | ORAL_TABLET | Freq: Every day | ORAL | 0 refills | Status: AC

## 2019-11-06 MED FILL — CYCLOBENZAPRINE 10 MG TAB: 10 | 7 days supply | Qty: 7 | Fill #0

## 2019-11-06 NOTE — ED Triage Notes (Signed)
Pt presents with c/o right elbow pain for approx 2 weeks. Pt reports he did hit his elbow on something hard prior to the pain while moving things but reports that the pain did not start right away after that. Pt in NAD.
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
## 2019-11-06 NOTE — Discharge Instructions (Addendum)
I recommend a combination of tylenol and ibuprofen for management of your pain. You can take a low dose of both at the same time. I recommend 325 mg of Tylenol combined with 400 mg of ibuprofen. This is one regular Tylenol and two regular ibuprofen. You can take these 2-3 times for day for your pain. Please try to take these medications with a small amount of food as well to prevent upsetting your stomach.  Also, please consider topical pain relieving creams such as Voltaran Gel, BioFreeze, or Icy Hot. There is also a pain relieving cream made by [employer]. You should be able to find all of these at your local pharmacy.   I am prescribing you a strong muscle relaxer called flexeril. Please only take this medication once in the evening with dinner. This medication can make you quite drowsy. Do not mix it with alcohol. Do not drive a vehicle after taking it.   Please adhere to the "RICE Method" for rehabbing. This stands for "rest, ice, compress, and elevate". Please continue to perform stretches and exercise as much as your pain allows. It is important that you keep moving to help heal your injury.   Please return to the ER with any new or worsening symptoms.  It was a pleasure to meet you.

## 2019-11-06 NOTE — Progress Notes (Signed)
Orthopedic Tech Progress Note Patient Details:  Alan Williams 1980-03-30 [id number]  Ortho Devices Ortho Device/Splint Location: shoulder immobilizer RUE Ortho Device/Splint Interventions: Ordered, Application   Post Interventions Patient Tolerated: Well Instructions Provided: Care of device   [name] 11/06/2019, 11:02 AM

## 2019-11-09 ENCOUNTER — Ambulatory Visit: Payer: Self-pay | Attending: Nurse Practitioner | Admitting: Nurse Practitioner

## 2019-11-09 ENCOUNTER — Other Ambulatory Visit: Payer: Self-pay | Admitting: Nurse Practitioner

## 2019-11-09 ENCOUNTER — Other Ambulatory Visit: Payer: Self-pay

## 2019-11-09 ENCOUNTER — Encounter: Payer: Self-pay | Admitting: Nurse Practitioner

## 2019-11-09 VITALS — BP 136/94 | HR 102 | Temp 97.7°F | Ht 68.0 in | Wt 236.8 lb

## 2019-11-09 DIAGNOSIS — F411 Generalized anxiety disorder: Secondary | ICD-10-CM

## 2019-11-09 DIAGNOSIS — Z23 Encounter for immunization: Secondary | ICD-10-CM

## 2019-11-09 DIAGNOSIS — M25521 Pain in right elbow: Secondary | ICD-10-CM

## 2019-11-09 DIAGNOSIS — K219 Gastro-esophageal reflux disease without esophagitis: Secondary | ICD-10-CM

## 2019-11-09 DIAGNOSIS — E1165 Type 2 diabetes mellitus with hyperglycemia: Secondary | ICD-10-CM

## 2019-11-09 DIAGNOSIS — I10 Essential (primary) hypertension: Secondary | ICD-10-CM

## 2019-11-09 LAB — GLUCOSE, POCT (MANUAL RESULT ENTRY): POC Glucose: 186 mg/dl — AB (ref 70–99)

## 2019-11-09 MED ORDER — HYDROXYZINE HCL 50 MG PO TABS: 50.0000 mg | ORAL_TABLET | Freq: Three times a day (TID) | ORAL | 0 refills | Status: AC | PRN

## 2019-11-09 MED ORDER — GABAPENTIN 300 MG PO CAPS: 300.0000 mg | ORAL_CAPSULE | Freq: Three times a day (TID) | ORAL | 3 refills | Status: DC

## 2019-11-09 MED ORDER — OMEPRAZOLE 20 MG PO CPDR: 20.0000 mg | DELAYED_RELEASE_CAPSULE | Freq: Two times a day (BID) | ORAL | 3 refills | Status: DC

## 2019-11-09 MED FILL — GABAPENTIN 300 MG CAPSULE: 300 | 30 days supply | Qty: 90 | Fill #0

## 2019-11-09 MED FILL — OMEPRAZOLE 20 MG CAP: 20 | 30 days supply | Qty: 60 | Fill #0
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
## 2019-11-15 ENCOUNTER — Ambulatory Visit: Payer: Self-pay

## 2019-12-13 MED FILL — ?METFORMIN HCL 500MG TABL: 500 | 30 days supply | Qty: 120 | Fill #1

## 2019-12-13 MED FILL — GLIMEPIRIDE 4 MG TABS: 4 | 30 days supply | Qty: 30 | Fill #1

## 2019-12-13 MED FILL — AMLODIPINE BESYLATE 10 MG T: 10 | 30 days supply | Qty: 30 | Fill #1

## 2020-01-15 ENCOUNTER — Other Ambulatory Visit: Payer: Self-pay

## 2020-01-15 ENCOUNTER — Ambulatory Visit
Admission: EM | Admit: 2020-01-15 | Discharge: 2020-01-15 | Disposition: A | Discharge status: Home / Self Care | Payer: Self-pay | Attending: Emergency Medicine | Admitting: Emergency Medicine

## 2020-01-15 ENCOUNTER — Other Ambulatory Visit: Payer: Self-pay | Admitting: Emergency Medicine

## 2020-01-15 DIAGNOSIS — J069 Acute upper respiratory infection, unspecified: Secondary | ICD-10-CM

## 2020-01-15 MED ORDER — BENZONATATE 100 MG PO CAPS: 100.0000 mg | ORAL_CAPSULE | Freq: Three times a day (TID) | ORAL | 0 refills | Status: DC

## 2020-01-15 MED ORDER — FLUTICASONE PROPIONATE 50 MCG/ACT NA SUSP: 1.0000 | Freq: Every day | NASAL | 0 refills | Status: DC

## 2020-01-15 MED ORDER — CETIRIZINE HCL 10 MG PO TABS: 10.0000 mg | ORAL_TABLET | Freq: Every day | ORAL | 0 refills | Status: DC

## 2020-01-15 MED ORDER — PREDNISONE 50 MG PO TABS: 50.0000 mg | ORAL_TABLET | Freq: Every day | ORAL | 0 refills | Status: DC

## 2020-01-15 MED FILL — BENZONATATE 100 MG CAPS: 100 | 7 days supply | Qty: 21 | Fill #0

## 2020-01-15 MED FILL — CETIRIZINE HCL 10 MG TABS: 10 | 30 days supply | Qty: 30 | Fill #0

## 2020-01-15 MED FILL — ?predniSONE 10MG TABS: 10 | 5 days supply | Qty: 25 | Fill #0

## 2020-01-15 MED FILL — FLUTICASONE PROP 50 MCG SPR: 50 | 30 days supply | Qty: 16 | Fill #0
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
## 2020-01-15 NOTE — ED Triage Notes (Signed)
Pt c/o cough/chills, CP started 11/25-NAD-steady gait

## 2020-01-15 NOTE — Discharge Instructions (Addendum)
Your COVID test is pending - it is important to quarantine / isolate at home until your results are back. °If you test positive and would like further evaluation for persistent or worsening symptoms, you may schedule an E-visit or virtual (video) visit throughout the [facility] MyChart app or website. ° °PLEASE NOTE: If you develop severe chest pain or shortness of breath please go to the ER or call 9-1-1 for further evaluation --> DO NOT schedule electronic or virtual visits for this. °Please call our office for further guidance / recommendations as needed. ° °For information about the Covid vaccine, please visit [email or web address] °

## 2020-01-16 LAB — NOVEL CORONAVIRUS, NAA: SARS-CoV-2, NAA: DETECTED — AB

## 2020-01-16 LAB — SARS-COV-2, NAA 2 DAY TAT

## 2020-01-17 ENCOUNTER — Telehealth (HOSPITAL_COMMUNITY): Payer: Self-pay | Admitting: Nurse Practitioner

## 2020-01-17 ENCOUNTER — Other Ambulatory Visit (HOSPITAL_COMMUNITY): Payer: Self-pay | Admitting: Family

## 2020-01-17 DIAGNOSIS — U071 COVID-19: Secondary | ICD-10-CM

## 2020-01-17 NOTE — Progress Notes (Signed)
I connected by phone with Alan Williams on 01/17/2020 at 6:37 PM to discuss the potential use of a new treatment for mild to moderate COVID-19 viral infection in non-hospitalized patients.  This patient is a 39 y.o. male that meets the FDA criteria for Emergency Use Authorization of COVID monoclonal antibody casirivimab/imdevimab, bamlanivimab/eteseviamb, or sotrovimab.  Has a (+) direct SARS-CoV-2 viral test result  Has mild or moderate COVID-19   Is NOT hospitalized due to COVID-19  Is within 10 days of symptom onset  Has at least one of the high risk factor(s) for progression to severe COVID-19 and/or hospitalization as defined in EUA.  Specific high risk criteria : Diabetes and Cardiovascular disease or hypertension   Symptoms of cough, congestion, fatigue and aches began 01/09/20/   I have spoken and communicated the following to the patient or parent/caregiver regarding COVID monoclonal antibody treatment:  1. FDA has authorized the emergency use for the treatment of mild to moderate COVID-19 in adults and pediatric patients with positive results of direct SARS-CoV-2 viral testing who are 29 years of age and older weighing at least 40 kg, and who are at high risk for progressing to severe COVID-19 and/or hospitalization.  2. The significant known and potential risks and benefits of COVID monoclonal antibody, and the extent to which such potential risks and benefits are unknown.  3. Information on available alternative treatments and the risks and benefits of those alternatives, including clinical trials.  4. Patients treated with COVID monoclonal antibody should continue to self-isolate and use infection control measures (e.g., wear mask, isolate, social distance, avoid sharing personal items, clean and disinfect "high touch" surfaces, and frequent handwashing) according to CDC guidelines.   5. The patient or parent/caregiver has the option to accept or refuse COVID monoclonal  antibody treatment.  After reviewing this information with the patient, the patient has agreed to receive one of the available covid 19 monoclonal antibodies and will be provided an appropriate fact sheet prior to infusion. Morton Stall, NP 01/17/2020 6:37 PM

## 2020-01-17 NOTE — Telephone Encounter (Signed)
Pt returned call to discuss with patient about Covid symptoms and the use of the monoclonal antibody infusion for those with mild to moderate Covid symptoms and at a high risk of hospitalization.     Pt appears to qualify for this infusion due to co-morbid conditions and/or a member of an at-risk group in accordance with the FDA Emergency Use Authorization.    Risk factors include: AA ethnicity, DDM, HTN   Symptom onset: 11/30 with sweating, HA, copious nasal mucous, joint pain.  He states he does not currently have sxs, but is interested in the treatment based on his health history.   Tested positive for COVID 19: 12/01 at [facility]    Discussed information regarding costs of monoclonal antibody treatment, given both CPT & REV codes, and encouraged patient to call their health insurance company to verify cost of treatment that pt will be financially responsible for.  Patient aware they will receive a call from APP for further information and to schedule appointment. All questions answered.

## 2020-01-17 NOTE — Telephone Encounter (Signed)
Unable to leave message, as VM not set up.  Patient has history of DM and hypertension.

## 2020-01-18 ENCOUNTER — Ambulatory Visit (HOSPITAL_COMMUNITY)
Admission: RE | Admit: 2020-01-18 | Discharge: 2020-01-18 | Disposition: A | Discharge status: Home / Self Care | Payer: Self-pay | Source: Ambulatory Visit | Attending: Pulmonary Disease | Admitting: Pulmonary Disease

## 2020-01-18 DIAGNOSIS — U071 COVID-19: Secondary | ICD-10-CM | POA: Insufficient documentation

## 2020-01-18 MED ORDER — SODIUM CHLORIDE 0.9 % IV SOLN: Freq: Once | INTRAVENOUS | Status: AC

## 2020-01-18 MED ORDER — EPINEPHRINE 0.3 MG/0.3ML IJ SOAJ: 0.3000 mg | Freq: Once | INTRAMUSCULAR | Status: DC | PRN

## 2020-01-18 MED ORDER — FAMOTIDINE IN NACL 20-0.9 MG/50ML-% IV SOLN: 20.0000 mg | Freq: Once | INTRAVENOUS | Status: DC | PRN

## 2020-01-18 MED ORDER — METHYLPREDNISOLONE SODIUM SUCC 125 MG IJ SOLR: 125.0000 mg | Freq: Once | INTRAMUSCULAR | Status: DC | PRN

## 2020-01-18 MED ORDER — DIPHENHYDRAMINE HCL 50 MG/ML IJ SOLN: 50.0000 mg | Freq: Once | INTRAMUSCULAR | Status: DC | PRN

## 2020-01-18 MED ORDER — ALBUTEROL SULFATE HFA 108 (90 BASE) MCG/ACT IN AERS: 2.0000 | INHALATION_SPRAY | Freq: Once | RESPIRATORY_TRACT | Status: DC | PRN

## 2020-01-18 MED ORDER — SODIUM CHLORIDE 0.9 % IV SOLN: INTRAVENOUS | Status: DC | PRN

## 2020-01-18 NOTE — Progress Notes (Signed)
Diagnosis: COVID-19  Physician: Dr. Patrick Wright  Procedure: Covid Infusion Clinic Med: Sotrovimab infusion - Provided patient with sotrovimab fact sheet for patients, parents, and caregivers prior to infusion.   Complications: No immediate complications noted  Discharge: Discharged home    

## 2020-01-18 NOTE — Progress Notes (Deleted)
Patient reviewed Fact Sheet for Patients, Parents, and Caregivers for Emergency Use Authorization (EUA) of Sotrovimab for the Treatment of Coronavirus. Patient also reviewed and is agreeable to the estimated cost of treatment. Patient is agreeable to proceed.   

## 2020-01-18 NOTE — Discharge Instructions (Signed)
10 Things You Can Do to Manage Your COVID-19 Symptoms at Home If you have possible or confirmed COVID-19: 1. Stay home from work and school. And stay away from other public places. If you must go out, avoid using any kind of public transportation, ridesharing, or taxis. 2. Monitor your symptoms carefully. If your symptoms get worse, call your healthcare provider immediately. 3. Get rest and stay hydrated. 4. If you have a medical appointment, call the healthcare provider ahead of time and tell them that you have or may have COVID-19. 5. For medical emergencies, call 911 and notify the dispatch personnel that you have or may have COVID-19. 6. Cover your cough and sneezes with a tissue or use the inside of your elbow. 7. Wash your hands often with soap and water for at least 20 seconds or clean your hands with an alcohol-based hand sanitizer that contains at least 60% alcohol. 8. As much as possible, stay in a specific room and away from other people in your home. Also, you should use a separate bathroom, if available. If you need to be around other people in or outside of the home, wear a mask. 9. Avoid sharing personal items with other people in your household, like dishes, towels, and bedding. 10. Clean all surfaces that are touched often, like counters, tabletops, and doorknobs. Use household cleaning sprays or wipes according to the label instructions. cdc.gov/coronavirus 08/16/2018 This information is not intended to replace advice given to you by your health care provider. Make sure you discuss any questions you have with your health care provider. Document Revised: 01/18/2019 Document Reviewed: 01/18/2019 Elsevier Patient Education  2020 Elsevier Inc. What types of side effects do monoclonal antibody drugs cause?  Common side effects  In general, the more common side effects caused by monoclonal antibody drugs include: . Allergic reactions, such as hives or itching . Flu-like signs and  symptoms, including chills, fatigue, fever, and muscle aches and pains . Nausea, vomiting . Diarrhea . Skin rashes . Low blood pressure   The CDC is recommending patients who receive monoclonal antibody treatments wait at least 90 days before being vaccinated.  Currently, there are no data on the safety and efficacy of mRNA COVID-19 vaccines in persons who received monoclonal antibodies or convalescent plasma as part of COVID-19 treatment. Based on the estimated half-life of such therapies as well as evidence suggesting that reinfection is uncommon in the 90 days after initial infection, vaccination should be deferred for at least 90 days, as a precautionary measure until additional information becomes available, to avoid interference of the antibody treatment with vaccine-induced immune responses. If you have any questions or concerns after the infusion please call the Advanced Practice Provider on call at [phone]. This number is ONLY intended for your use regarding questions or concerns about the infusion post-treatment side-effects.  Please do not provide this number to others for use. For return to work notes please contact your primary care provider.   If someone you know is interested in receiving treatment please have them call the COVID hotline at [phone].   

## 2020-01-21 NOTE — Telephone Encounter (Signed)
Unable to close encounter without note.

## 2020-01-28 ENCOUNTER — Other Ambulatory Visit: Payer: Self-pay

## 2020-01-28 ENCOUNTER — Ambulatory Visit: Payer: Self-pay | Admitting: Family

## 2020-01-28 ENCOUNTER — Ambulatory Visit: Payer: Self-pay | Attending: Physician Assistant | Admitting: Physician Assistant

## 2020-01-28 ENCOUNTER — Ambulatory Visit: Payer: Self-pay | Admitting: Family Medicine

## 2020-01-28 DIAGNOSIS — I10 Essential (primary) hypertension: Secondary | ICD-10-CM

## 2020-01-28 DIAGNOSIS — E785 Hyperlipidemia, unspecified: Secondary | ICD-10-CM

## 2020-01-28 DIAGNOSIS — E1169 Type 2 diabetes mellitus with other specified complication: Secondary | ICD-10-CM

## 2020-01-28 DIAGNOSIS — E1165 Type 2 diabetes mellitus with hyperglycemia: Secondary | ICD-10-CM

## 2020-01-28 DIAGNOSIS — Z8616 Personal history of COVID-19: Secondary | ICD-10-CM
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
## 2020-01-28 NOTE — Progress Notes (Signed)
Patient verified DOB Patient has taken medication today. Patient denies pain at this time Patient still has a cough present intermittently.  Patient denies HA or body aches. Patient denies any fever. Patient needs BP and DM refilled

## 2020-01-29 ENCOUNTER — Other Ambulatory Visit: Payer: Self-pay | Admitting: Physician Assistant

## 2020-01-29 MED ORDER — ATORVASTATIN CALCIUM 40 MG PO TABS: 40.0000 mg | ORAL_TABLET | Freq: Every day | ORAL | 0 refills | Status: DC

## 2020-01-29 MED ORDER — TRUE METRIX BLOOD GLUCOSE TEST VI STRP: ORAL_STRIP | 6 refills | Status: DC

## 2020-01-29 MED ORDER — BD PEN NEEDLE MINI U/F 31G X 5 MM MISC: 3 refills | Status: DC

## 2020-01-29 MED ORDER — AMLODIPINE BESYLATE 10 MG PO TABS: 10.0000 mg | ORAL_TABLET | Freq: Every day | ORAL | 0 refills | Status: DC

## 2020-01-29 MED ORDER — INSULIN GLARGINE 100 UNIT/ML SOLOSTAR PEN: 20.0000 [IU] | PEN_INJECTOR | Freq: Every day | SUBCUTANEOUS | 2 refills | Status: DC

## 2020-01-29 MED ORDER — METFORMIN HCL 500 MG PO TABS: 1000.0000 mg | ORAL_TABLET | Freq: Two times a day (BID) | ORAL | 0 refills | Status: DC

## 2020-01-29 MED ORDER — GLIMEPIRIDE 4 MG PO TABS: ORAL_TABLET | ORAL | 0 refills | Status: DC

## 2020-01-29 MED FILL — TRUE METRIX TEST STRIP: 30 days supply | Qty: 100 | Fill #0

## 2020-01-29 MED FILL — AMLODIPINE BESYLATE 10 MG T: 10 | 30 days supply | Qty: 30 | Fill #0

## 2020-01-29 MED FILL — ?BASAGLAR 100 UNITS/ML KWPE: 100 | 30 days supply | Qty: 6 | Fill #0

## 2020-01-29 MED FILL — TRUEPLUS 5-BEVEL PEN NEEDLE: 31G X 5 MM | 90 days supply | Qty: 100 | Fill #0

## 2020-01-29 MED FILL — ?ATORVASTATIN 40MG TABLET: 40 | 30 days supply | Qty: 30 | Fill #0

## 2020-01-29 MED FILL — METFORMIN HCL 500 MG TABS: 500 | 30 days supply | Qty: 120 | Fill #0

## 2020-01-29 MED FILL — GLIMEPIRIDE 4 MG TABS: 4 | 30 days supply | Qty: 30 | Fill #0

## 2020-01-30 ENCOUNTER — Encounter: Payer: Self-pay | Admitting: Physician Assistant

## 2020-01-30 DIAGNOSIS — E1165 Type 2 diabetes mellitus with hyperglycemia: Secondary | ICD-10-CM | POA: Insufficient documentation

## 2020-01-30 DIAGNOSIS — Z8616 Personal history of COVID-19: Secondary | ICD-10-CM | POA: Insufficient documentation

## 2020-01-30 DIAGNOSIS — E1169 Type 2 diabetes mellitus with other specified complication: Secondary | ICD-10-CM | POA: Insufficient documentation

## 2020-01-30 DIAGNOSIS — I10 Essential (primary) hypertension: Secondary | ICD-10-CM | POA: Insufficient documentation

## 2020-02-29 MED FILL — METFORMIN HCL 500 MG TABS: 500 | 30 days supply | Qty: 120 | Fill #1

## 2020-02-29 MED FILL — GLIMEPIRIDE 4 MG TABS: 4 | 30 days supply | Qty: 30 | Fill #1

## 2020-03-19 MED FILL — TRUEplus LANCETS 28G MISC: 34 days supply | Qty: 100 | Fill #1

## 2020-04-16 MED FILL — hydrOXYzine HCL 50 MG TABS: 50 | 18 days supply | Qty: 90 | Fill #0

## 2020-04-16 MED FILL — GLIMEPIRIDE 4 MG TABS: 4 | 30 days supply | Qty: 30 | Fill #2

## 2020-04-16 MED FILL — ?OMEPRAZOLE 20 MG CPDR: 20 | 30 days supply | Qty: 60 | Fill #1

## 2020-04-16 MED FILL — METFORMIN HCL 500 MG TABS: 500 | 30 days supply | Qty: 120 | Fill #2

## 2020-05-22 ENCOUNTER — Other Ambulatory Visit: Payer: Self-pay | Admitting: Physician Assistant

## 2020-05-23 ENCOUNTER — Other Ambulatory Visit: Payer: Self-pay

## 2020-05-28 ENCOUNTER — Other Ambulatory Visit: Payer: Self-pay

## 2020-05-30 ENCOUNTER — Encounter: Payer: Self-pay | Admitting: Emergency Medicine

## 2020-05-30 ENCOUNTER — Ambulatory Visit
Admission: EM | Admit: 2020-05-30 | Discharge: 2020-05-30 | Disposition: A | Discharge status: Home / Self Care | Payer: Self-pay | Attending: Family Medicine | Admitting: Family Medicine

## 2020-05-30 ENCOUNTER — Other Ambulatory Visit: Payer: Self-pay

## 2020-05-30 DIAGNOSIS — H66002 Acute suppurative otitis media without spontaneous rupture of ear drum, left ear: Secondary | ICD-10-CM

## 2020-05-30 DIAGNOSIS — H9202 Otalgia, left ear: Secondary | ICD-10-CM

## 2020-05-30 MED ORDER — AMOXICILLIN-POT CLAVULANATE 875-125 MG PO TABS: 1.0000 | ORAL_TABLET | Freq: Two times a day (BID) | ORAL | 0 refills | Status: AC

## 2020-05-30 MED ORDER — IBUPROFEN 800 MG PO TABS: 800.0000 mg | ORAL_TABLET | Freq: Three times a day (TID) | ORAL | 0 refills | Status: DC | PRN
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
## 2020-05-30 NOTE — Discharge Instructions (Signed)
I have sent in Augmentin for you to take twice a day for 7 days.  I have sent in ibuprofen for you to take one tablet every 8 hours as needed for pain and inflammation.  Follow up with this office or with primary care if symptoms are persisting.  Follow up in the ER for high fever, trouble swallowing, trouble breathing, other concerning symptoms.

## 2020-05-30 NOTE — ED Triage Notes (Signed)
Pt presents today with c/o of left ear pain x 2 days; denies fever.

## 2020-06-02 ENCOUNTER — Ambulatory Visit: Payer: Self-pay | Admitting: Physician Assistant

## 2020-06-02 ENCOUNTER — Other Ambulatory Visit: Payer: Self-pay

## 2020-06-02 VITALS — BP 159/104 | HR 90 | Temp 98.2°F | Resp 18 | Ht 66.0 in | Wt 230.0 lb

## 2020-06-02 DIAGNOSIS — E785 Hyperlipidemia, unspecified: Secondary | ICD-10-CM

## 2020-06-02 DIAGNOSIS — K219 Gastro-esophageal reflux disease without esophagitis: Secondary | ICD-10-CM

## 2020-06-02 DIAGNOSIS — E559 Vitamin D deficiency, unspecified: Secondary | ICD-10-CM

## 2020-06-02 DIAGNOSIS — I10 Essential (primary) hypertension: Secondary | ICD-10-CM

## 2020-06-02 DIAGNOSIS — E1165 Type 2 diabetes mellitus with hyperglycemia: Secondary | ICD-10-CM

## 2020-06-02 DIAGNOSIS — M25521 Pain in right elbow: Secondary | ICD-10-CM

## 2020-06-02 DIAGNOSIS — H60392 Other infective otitis externa, left ear: Secondary | ICD-10-CM

## 2020-06-02 DIAGNOSIS — J302 Other seasonal allergic rhinitis: Secondary | ICD-10-CM

## 2020-06-02 DIAGNOSIS — E1169 Type 2 diabetes mellitus with other specified complication: Secondary | ICD-10-CM

## 2020-06-02 LAB — POCT GLYCOSYLATED HEMOGLOBIN (HGB A1C): Hemoglobin A1C: 11.4 % — AB (ref 4.0–5.6)

## 2020-06-02 MED ORDER — CIPROFLOXACIN-DEXAMETHASONE 0.3-0.1 % OT SUSP
4.0000 [drp] | Freq: Two times a day (BID) | OTIC | 0 refills | Status: AC
  Filled 2020-06-02: qty 7.5, 7d supply, fill #0

## 2020-06-02 MED ORDER — FLUTICASONE PROPIONATE 50 MCG/ACT NA SUSP
1.0000 | Freq: Every day | NASAL | 0 refills | Status: DC
  Filled 2020-06-02: qty 16, 30d supply, fill #0

## 2020-06-02 MED ORDER — CETIRIZINE HCL 10 MG PO TABS
10.0000 mg | ORAL_TABLET | Freq: Every day | ORAL | 0 refills | Status: DC
  Filled 2020-06-02: qty 30, 30d supply, fill #0

## 2020-06-02 MED ORDER — AMLODIPINE BESYLATE 10 MG PO TABS
10.0000 mg | ORAL_TABLET | Freq: Every day | ORAL | 0 refills | Status: DC
  Filled 2020-06-02: qty 30, 30d supply, fill #0

## 2020-06-02 MED ORDER — METFORMIN HCL 500 MG PO TABS
1000.0000 mg | ORAL_TABLET | Freq: Two times a day (BID) | ORAL | 0 refills | Status: DC
  Filled 2020-06-02: qty 120, 30d supply, fill #0

## 2020-06-02 MED ORDER — TRUEPLUS 5-BEVEL PEN NEEDLES 31G X 5 MM MISC
3 refills | Status: DC
  Filled 2020-06-02: qty 100, 100d supply, fill #0
  Filled 2021-04-21 – 2021-05-11 (×2): qty 100, 90d supply, fill #0

## 2020-06-02 MED ORDER — INSULIN GLARGINE 100 UNIT/ML SOLOSTAR PEN
20.0000 [IU] | PEN_INJECTOR | Freq: Every day | SUBCUTANEOUS | 2 refills | Status: DC
  Filled 2020-06-02: qty 6, 30d supply, fill #0
  Filled 2020-07-15 – 2020-07-29 (×3): qty 6, 30d supply, fill #1

## 2020-06-02 MED ORDER — GABAPENTIN 300 MG PO CAPS
300.0000 mg | ORAL_CAPSULE | Freq: Every day | ORAL | 0 refills | Status: DC
  Filled 2020-06-02: qty 30, 30d supply, fill #0

## 2020-06-02 MED ORDER — GLIMEPIRIDE 4 MG PO TABS
ORAL_TABLET | ORAL | 0 refills | Status: DC
  Filled 2020-06-02: qty 30, 30d supply, fill #0

## 2020-06-02 MED ORDER — ATORVASTATIN CALCIUM 40 MG PO TABS
40.0000 mg | ORAL_TABLET | Freq: Every day | ORAL | 0 refills | Status: DC
  Filled 2020-06-02: qty 30, 30d supply, fill #0

## 2020-06-02 MED ORDER — OMEPRAZOLE 20 MG PO CPDR
20.0000 mg | DELAYED_RELEASE_CAPSULE | Freq: Two times a day (BID) | ORAL | 3 refills | Status: DC
  Filled 2020-06-02 – 2020-07-15 (×2): qty 60, 30d supply, fill #0
  Filled 2020-08-07: qty 180, 90d supply, fill #0

## 2020-06-02 NOTE — Progress Notes (Signed)
Patient has been out of DM medication for the past 5 days. Patient has not taken BP medications in 2 weeks.  Patient reports pain in the left ear with radiating down the jaw.

## 2020-06-02 NOTE — Patient Instructions (Signed)
For your ear infection, you will continue to use your oral antibiotic, I have also sent over some eardrops for you to use twice a day for the next 7 days.  I encourage you to take the Zyrtec as well as use the Flonase.  [facility]  I encourage you to check your blood glucose levels twice a day, keep a written log and have them available for your follow-up with the clinical pharmacist in 1 month.  I encourage you to check your blood pressure once daily, keep a written log and have that also available for your follow-up with the clinical pharmacist in 1 month.  We will call you with your lab results.  Please let us know if there is anything else we can do for you  Roney Jaffe, PA-C Physician Assistant [facility] [email or web address]    How to Take Your Blood Pressure Blood pressure is a measurement of how strongly your blood is pressing against the walls of your arteries. Arteries are blood vessels that carry blood from your heart throughout your body. Your health care provider takes your blood pressure at each office visit. You can also take your own blood pressure at home with a blood pressure monitor. You may need to take your own blood pressure to:  Confirm a diagnosis of high blood pressure (hypertension).  Monitor your blood pressure over time.  Make sure your blood pressure medicine is working. Supplies needed:  Blood pressure monitor.  Dining room chair to sit in.  Table or desk.  Small notebook and pencil or pen. How to prepare To get the most accurate reading, avoid the following for 30 minutes before you check your blood pressure:  Drinking caffeine.  Drinking alcohol.  Eating.  Smoking.  Exercising. Five minutes before you check your blood pressure:  Use the bathroom and urinate so that you have an empty bladder.  Sit quietly in a dining room  chair. Do not sit in a soft couch or an armchair. Do not talk. How to take your blood pressure To check your blood pressure, follow the instructions in the manual that came with your blood pressure monitor. If you have a digital blood pressure monitor, the instructions may be as follows: 1. Sit up straight in a chair. 2. Place your feet on the floor. Do not cross your ankles or legs. 3. Rest your left arm at the level of your heart on a table or desk or on the arm of a chair. 4. Pull up your shirt sleeve. 5. Wrap the blood pressure cuff around the upper part of your left arm, 1 inch (2.5 cm) above your elbow. It is best to wrap the cuff around bare skin. 6. Fit the cuff snugly around your arm. You should be able to place only one finger between the cuff and your arm. 7. Position the cord so that it rests in the bend of your elbow. 8. Press the power button. 9. Sit quietly while the cuff inflates and deflates. 10. Read the digital reading on the monitor screen and write the numbers down (record them) in a notebook. 11. Wait 2-3 minutes, then repeat the steps, starting at step 1.   What does my blood pressure reading mean? A blood pressure reading consists of a higher number over a lower number. Ideally, your blood pressure should be below 120/80. The first ("top") number is called the systolic pressure. It is a measure  of the pressure in your arteries as your heart beats. The second ("bottom") number is called the diastolic pressure. It is a measure of the pressure in your arteries as the heart relaxes. Blood pressure is classified into five stages. The following are the stages for adults who do not have a short-term serious illness or a chronic condition. Systolic pressure and diastolic pressure are measured in a unit called mm Hg (millimeters of mercury).  Normal  Systolic pressure: below 120.  Diastolic pressure: below 80. Elevated  Systolic pressure: 120-129.  Diastolic pressure: below  80. Hypertension stage 1  Systolic pressure: 130-139.  Diastolic pressure: 80-89. Hypertension stage 2  Systolic pressure: 140 or above.  Diastolic pressure: 90 or above. You can have elevated blood pressure or hypertension even if only the systolic or only the diastolic number in your reading is higher than normal. Follow these instructions at home:  Check your blood pressure as often as recommended by your health care provider.  Check your blood pressure at the same time every day.  Take your monitor to the next appointment with your health care provider to make sure that: ? You are using it correctly. ? It provides accurate readings.  Be sure you understand what your goal blood pressure numbers are.  Tell your health care provider if you are having any side effects from blood pressure medicine.  Keep all follow-up visits as told by your health care provider. This is important. General tips  Your health care provider can suggest a reliable monitor that will meet your needs. There are several types of home blood pressure monitors.  Choose a monitor that has an arm cuff. Do not choose a monitor that measures your blood pressure from your wrist or finger.  Choose a cuff that wraps snugly around your upper arm. You should be able to fit only one finger between your arm and the cuff.  You can buy a blood pressure monitor at most drugstores or online. Where to find more information American Heart Association: www.heart.org Contact a health care provider if:  Your blood pressure is consistently high. Get help right away if:  Your systolic blood pressure is higher than 180.  Your diastolic blood pressure is higher than 120. Summary  Blood pressure is a measurement of how strongly your blood is pressing against the walls of your arteries.  A blood pressure reading consists of a higher number over a lower number. Ideally, your blood pressure should be below 120/80.  Check  your blood pressure at the same time every day.  Avoid caffeine, alcohol, smoking, and exercise for 30 minutes prior to checking your blood pressure. These agents can affect the accuracy of the blood pressure reading. This information is not intended to replace advice given to you by your health care provider. Make sure you discuss any questions you have with your health care provider. Document Revised: 01/26/2019 Document Reviewed: 01/26/2019 Elsevier Patient Education  2021 [employer].
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
## 2020-06-03 ENCOUNTER — Other Ambulatory Visit: Payer: Self-pay

## 2020-06-03 ENCOUNTER — Telehealth: Payer: Self-pay | Admitting: *Deleted

## 2020-06-03 DIAGNOSIS — K219 Gastro-esophageal reflux disease without esophagitis: Secondary | ICD-10-CM | POA: Insufficient documentation

## 2020-06-03 DIAGNOSIS — E559 Vitamin D deficiency, unspecified: Secondary | ICD-10-CM | POA: Insufficient documentation

## 2020-06-03 DIAGNOSIS — H60392 Other infective otitis externa, left ear: Secondary | ICD-10-CM | POA: Insufficient documentation

## 2020-06-03 DIAGNOSIS — J302 Other seasonal allergic rhinitis: Secondary | ICD-10-CM | POA: Insufficient documentation

## 2020-06-03 LAB — COMP. METABOLIC PANEL (12)
AST: 19 IU/L (ref 0–40)
Albumin/Globulin Ratio: 1.8 (ref 1.2–2.2)
Albumin: 4.5 g/dL (ref 4.0–5.0)
Alkaline Phosphatase: 83 IU/L (ref 44–121)
BUN/Creatinine Ratio: 16 (ref 9–20)
BUN: 14 mg/dL (ref 6–24)
Bilirubin Total: 0.3 mg/dL (ref 0.0–1.2)
Calcium: 9.4 mg/dL (ref 8.7–10.2)
Chloride: 100 mmol/L (ref 96–106)
Creatinine, Ser: 0.87 mg/dL (ref 0.76–1.27)
Globulin, Total: 2.5 g/dL (ref 1.5–4.5)
Glucose: 323 mg/dL — ABNORMAL HIGH (ref 65–99)
Potassium: 4.5 mmol/L (ref 3.5–5.2)
Sodium: 137 mmol/L (ref 134–144)
Total Protein: 7 g/dL (ref 6.0–8.5)
eGFR: 111 mL/min/{1.73_m2} (ref >59)

## 2020-06-03 LAB — CBC WITH DIFFERENTIAL/PLATELET
Basophils Absolute: 0 10*3/uL (ref 0.0–0.2)
Basos: 1 %
EOS (ABSOLUTE): 0.1 10*3/uL (ref 0.0–0.4)
Eos: 2 %
Hematocrit: 48.8 % (ref 37.5–51.0)
Hemoglobin: 15.7 g/dL (ref 13.0–17.7)
Immature Grans (Abs): 0 10*3/uL (ref 0.0–0.1)
Immature Granulocytes: 1 %
Lymphocytes Absolute: 1.3 10*3/uL (ref 0.7–3.1)
Lymphs: 32 %
MCH: 25.5 pg — ABNORMAL LOW (ref 26.6–33.0)
MCHC: 32.2 g/dL (ref 31.5–35.7)
MCV: 79 fL (ref 79–97)
Monocytes Absolute: 0.6 10*3/uL (ref 0.1–0.9)
Monocytes: 14 %
Neutrophils Absolute: 2.1 10*3/uL (ref 1.4–7.0)
Neutrophils: 50 %
Platelets: 277 10*3/uL (ref 150–450)
RBC: 6.16 x10E6/uL — ABNORMAL HIGH (ref 4.14–5.80)
RDW: 12.9 % (ref 11.6–15.4)
WBC: 4 10*3/uL (ref 3.4–10.8)

## 2020-06-03 LAB — LIPID PANEL
Chol/HDL Ratio: 4.3 ratio (ref 0.0–5.0)
Cholesterol, Total: 186 mg/dL (ref 100–199)
HDL: 43 mg/dL (ref >39)
LDL Chol Calc (NIH): 119 mg/dL — ABNORMAL HIGH (ref 0–99)
Triglycerides: 133 mg/dL (ref 0–149)
VLDL Cholesterol Cal: 24 mg/dL (ref 5–40)

## 2020-06-03 LAB — VITAMIN D 25 HYDROXY (VIT D DEFICIENCY, FRACTURES): Vit D, 25-Hydroxy: 9.3 ng/mL — ABNORMAL LOW (ref 30.0–100.0)

## 2020-06-03 LAB — MICROALBUMIN / CREATININE URINE RATIO
Creatinine, Urine: 110.4 mg/dL
Microalb/Creat Ratio: 59 mg/g creat — ABNORMAL HIGH (ref 0–29)
Microalbumin, Urine: 65 ug/mL

## 2020-06-03 LAB — TSH: TSH: 2.33 u[IU]/mL (ref 0.450–4.500)

## 2020-06-03 MED ORDER — VITAMIN D (ERGOCALCIFEROL) 1.25 MG (50000 UNIT) PO CAPS
50000.0000 [IU] | ORAL_CAPSULE | ORAL | 2 refills | Status: DC
  Filled 2020-06-03: qty 4, 28d supply, fill #0

## 2020-06-03 NOTE — Telephone Encounter (Signed)
-----   Message from Roney Jaffe, [place] sent at 06/03/2020  9:29 AM EDT ----- Please call patient and let him know that his thyroid function, kidney and liver function are within normal limits, he does not show signs of anemia.  His cholesterol overall is within normal limits, however his LDL is elevated, he needs to make sure he is taking his cholesterol medication on a daily basis, and continuing to follow a low-cholesterol diet.  His vitamin D is very low, he needs to take 50,000 units once a week for the next 12 weeks.  Prescription sent to his pharmacy.

## 2020-06-03 NOTE — Telephone Encounter (Signed)
Medical Assistant left message on patient's home and cell voicemail. Voicemail states to give a call back to Marvin Grabill with [facility] at [phone]. 

## 2020-06-03 NOTE — Addendum Note (Signed)
Addended by: Roney Jaffe on: 06/03/2020 09:32 AM   Modules accepted: Orders

## 2020-06-09 ENCOUNTER — Other Ambulatory Visit: Payer: Self-pay

## 2020-06-10 ENCOUNTER — Other Ambulatory Visit: Payer: Self-pay

## 2020-07-01 NOTE — Progress Notes (Deleted)
S:    PCP: Maurene Capes  PMH: HTN, T2DM, HLD, GERD, Obesity, Diverticulitis  No chief complaint on file.  Patient arrives in good spirits.  Presents for diabetes evaluation, education, and management. Patient was referred and last seen by Primary Care Provider on 06/02/20. As of 06/02/20 A1c was 11.4% and glucose was 323 mg/dL. Electrolytes were WNL but pt has an elevated microalb/creat ratio of 59mg /g. RBC, LDLs also elevated.    Today pt reports:   Polyuria? Polydipsia?  Polyphagia?    Patient reports Diabetes was diagnosed in *** aprox. 2016 .   Family/Social History:  FHx: HTN Tobacco:  Alcohol:    Insurance coverage/medication affordability: ***  Medication adherence reported *** .    Current diabetes medications include:  - Glimepiride 4 mg tablet - Take 1 tab PO daily  - Insulin glargine 100 unit/mL Solostar: Inject 20 units into the skin daily at 10 pm.  - Metformin 500 mg tablet - Take 2 tablets ( 1,000 mg total) by mouth BID with meals.   Current hypertension medications include:  - Amlodipine 10 mg tablet - Take 1 tablet PO daily   Current hyperlipidemia medications include:  - Atorvastatin 40 mg tablet -Take 1 tablet PO daily after evening meal to lower cholesterol.   Patient {Actions; denies-reports:120008} hypoglycemic events.  Patient reported dietary habits: Eats *** meals/day Breakfast:*** Lunch:*** Dinner:*** Snacks:*** Drinks:***  Patient-reported exercise habits: ***   Patient {Actions; denies-reports:120008} nocturia (nighttime urination).  Patient {Actions; denies-reports:120008} neuropathy (nerve pain). Patient {Actions; denies-reports:120008} visual changes. Patient {Actions; denies-reports:120008} self foot exams.     O:  Physical Exam   ROS   Lab Results  Component Value Date   HGBA1C 11.4 (A) 06/02/2020   There were no vitals filed for this visit.  Lipid Panel     Component Value Date/Time   CHOL 186 06/02/2020 1026    TRIG 133 06/02/2020 1026   HDL 43 06/02/2020 1026   CHOLHDL 4.3 06/02/2020 1026   CHOLHDL 3.6 08/27/2010 0521   VLDL 50 (H) 08/27/2010 0521   LDLCALC 119 (H) 06/02/2020 1026    Home fasting blood sugars: ***  2 hour post-meal/random blood sugars: ***.   Clinical Atherosclerotic Cardiovascular Disease (ASCVD): {YES/NO:21197} The 10-year ASCVD risk score 06/04/2020 DC Jr., et al., 2013) is: 15.3%   Values used to calculate the score:     Age: 40 years     Sex: Male     Is Non-Hispanic African American: Yes     Diabetic: Yes     Tobacco smoker: No     Systolic Blood Pressure: 159 mmHg     Is BP treated: Yes     HDL Cholesterol: 43 mg/dL     Total Cholesterol: 186 mg/dL    A/P: Diabetes longstanding currently uncontrolled. Patient is *** able to verbalize appropriate hypoglycemia management plan. Medication adherence appears ***. Control is suboptimal due to ***. -{Meds adjust:18428} basal insulin *** (insulin ***). Patient will continue to titrate 1 unit every *** days if fasting blood sugar > 100mg /dl until fasting blood sugars reach goal or next visit.  -{Meds adjust:18428}  rapid insulin *** (insulin ***) to ***.  -{Meds adjust:18428} GLP-1 *** (generic name***) to ***.  -{Meds adjust:18428} SGLT2-I *** (generic name***) to ***. Counseled on sick day rules for ***. -Extensively discussed pathophysiology of diabetes, recommended lifestyle interventions, dietary effects on blood sugar control -Counseled on s/sx of and management of hypoglycemia -Next A1C anticipated 08/2020.   - increasing amaryl  to 8 mg only gives modest improvement --> not recommended, increases risk of hypoglycemia - Basal Insulin can be titrated 2 units q 3 days ( up to 0.5units/kg/day --> 52 units)   4 units of prandial (short- acting insulin) can be added and given at largest meal.  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  LDLs already elevated --> r/o SGLT2i  ACEi/ARB +  glycemic control may be beneficial in pt experiencing microalbuminuria.    ASCVD risk - primary***secondary prevention in patient with diabetes. Last LDL {Is/is not:9024} controlled. ASCVD risk score {Is/is not:9024} >20%  - {Desc; low/moderate/high:110033} intensity statin indicated. Aspirin {Is/is not:9024} indicated.  -{Meds adjust:18428} aspirin *** mg  -{Meds adjust:18428} ***statin *** mg.   Hypertension longstanding Stage II currently uncontrolled. BP on 06/02/20 was 159/104. Blood pressure goal = <130/80 mmHg. Medication adherence ***.  Blood pressure control is suboptimal due to ***. -***    Written patient instructions provided.  Total time in face to face counseling *** minutes.   Follow up Pharmacist/PCP*** Clinic Visit in ***.   Patient seen with ***

## 2020-07-02 ENCOUNTER — Other Ambulatory Visit: Payer: Self-pay

## 2020-07-02 ENCOUNTER — Encounter (INDEPENDENT_AMBULATORY_CARE_PROVIDER_SITE_OTHER): Payer: Self-pay

## 2020-07-02 ENCOUNTER — Ambulatory Visit: Payer: Self-pay | Attending: Nurse Practitioner | Admitting: Pharmacist

## 2020-07-02 ENCOUNTER — Ambulatory Visit: Payer: Self-pay | Admitting: Pharmacist

## 2020-07-02 DIAGNOSIS — E1165 Type 2 diabetes mellitus with hyperglycemia: Secondary | ICD-10-CM

## 2020-07-02 DIAGNOSIS — I10 Essential (primary) hypertension: Secondary | ICD-10-CM

## 2020-07-02 DIAGNOSIS — E785 Hyperlipidemia, unspecified: Secondary | ICD-10-CM

## 2020-07-02 DIAGNOSIS — J302 Other seasonal allergic rhinitis: Secondary | ICD-10-CM

## 2020-07-02 DIAGNOSIS — E1169 Type 2 diabetes mellitus with other specified complication: Secondary | ICD-10-CM

## 2020-07-02 LAB — GLUCOSE, POCT (MANUAL RESULT ENTRY): POC Glucose: 282 mg/dl — AB (ref 70–99)

## 2020-07-02 MED ORDER — ATORVASTATIN CALCIUM 40 MG PO TABS
40.0000 mg | ORAL_TABLET | Freq: Every day | ORAL | 2 refills | Status: DC
  Filled 2020-07-02: qty 30, 30d supply, fill #0
  Filled 2020-07-29: qty 30, 30d supply, fill #1

## 2020-07-02 MED ORDER — TRULICITY 0.75 MG/0.5ML ~~LOC~~ SOAJ
0.7500 mg | SUBCUTANEOUS | 0 refills | Status: DC
  Filled 2020-07-02: qty 2, 28d supply, fill #0

## 2020-07-02 MED ORDER — HYDROXYZINE HCL 50 MG PO TABS
ORAL_TABLET | ORAL | 0 refills | Status: DC
  Filled 2020-07-02: qty 90, 18d supply, fill #0

## 2020-07-02 MED ORDER — AMLODIPINE BESYLATE 10 MG PO TABS
10.0000 mg | ORAL_TABLET | Freq: Every day | ORAL | 2 refills | Status: DC
  Filled 2020-07-02: qty 30, 30d supply, fill #0
  Filled 2020-07-29: qty 30, 30d supply, fill #1

## 2020-07-02 MED ORDER — CETIRIZINE HCL 10 MG PO TABS
10.0000 mg | ORAL_TABLET | Freq: Every day | ORAL | 2 refills | Status: DC
  Filled 2020-07-02: qty 30, 30d supply, fill #0
  Filled 2020-08-08: qty 30, 30d supply, fill #1

## 2020-07-02 MED ORDER — FLUTICASONE PROPIONATE 50 MCG/ACT NA SUSP
1.0000 | Freq: Every day | NASAL | 0 refills | Status: DC
  Filled 2020-07-02: qty 16, 30d supply, fill #0

## 2020-07-02 MED ORDER — GLIMEPIRIDE 4 MG PO TABS
ORAL_TABLET | ORAL | 2 refills | Status: DC
  Filled 2020-07-02: qty 30, 30d supply, fill #0

## 2020-07-02 MED ORDER — METFORMIN HCL 500 MG PO TABS
1000.0000 mg | ORAL_TABLET | Freq: Two times a day (BID) | ORAL | 2 refills | Status: DC
  Filled 2020-07-02: qty 120, 30d supply, fill #0
  Filled 2020-07-29: qty 120, 30d supply, fill #1

## 2020-07-02 NOTE — Progress Notes (Unsigned)
    S:    PCP: Dr. Jillyn Hidden  No chief complaint on file.  Patient arrives in good spirits. Presents for diabetes management at the request of Dr. Jillyn Hidden. Patient was referred on 11/17/17.  At that visit, he reported not taking anti-hyperglycemic medications for the past 2 years.A1c resulted at 13.2. Metformin and glimepiride were initiated.   Family/Social History:  - FH: nothing pertinent listed - Tobacco: 3-4 cigarettes on the weekend - Alcohol: "a couple of drinks on the weekend"  Insurance coverage/medication affordability:  - Self-pay; [facility] pharmacy  Patient reports adherence with medications.  Current diabetes medications include:  - Glimepiride 4 mg daily before breakfast - Metformin 500 mg BID  Patient denies hypoglycemic events.   Patient reported dietary habits: Eats 2-3 meals/day - High intake of white carbs  - Denies sweets, sodas - Reports wife is a dietician  Patient-reported exercise habits:  - 40 minutes of walking day -5-15 minutes on exercise bike after eating   Patient reports improvement in polyuria, polydipsia, and polyphagia.  Patient denies neuropathy. Patient reports visual changes. Patient reports self foot exams.   O:  POCT: 247 Home glucose levels:  - reports monitoring "correctly" only for the past several days - Highest number ~240-250 range - Lowest number 150 (FPG)  Lab Results  Component Value Date   HGBA1C 11.4 (A) 06/02/2020   There were no vitals filed for this visit.  Lipid Panel     Component Value Date/Time   CHOL 186 06/02/2020 1026   TRIG 133 06/02/2020 1026   HDL 43 06/02/2020 1026   CHOLHDL 4.3 06/02/2020 1026   CHOLHDL 3.6 08/27/2010 0521   VLDL 50 (H) 08/27/2010 0521   LDLCALC 119 (H) 06/02/2020 1026    Clinical ASCVD: No   ASCVD risk: pt is 39; cannot calculate  A/P: Diabetes longstanding currently uncontrolled based on A1c of 13. Patient is able to verbalize appropriate hypoglycemia management plan. Patient is  adherent with medication. Control is suboptimal due to dietary indiscretion.  Pt reports improvement of hyperglycemia-associated symptoms since re-starting medication. His A1c makes him a candidate for combination injection therapy. However, he has responded well to oral therapy and is motivated to make lifestyle changes. Cost is a barrier with him. He agrees to increase metformin for now. I have emphasized that we may have to introduce a GLP-1 RA or insulin if he cannot achieve control with oral therapy. Would recommend to continue glimepiride for now as a short-term solution to high sugars but this should be discontinued in the future.   -Increased dose of metformin to 1000 mg BID. (patient takes two tablets of 500 mg BID) -Continued glimepiride 4 mg before breakfast.  -Extensively discussed pathophysiology of DM, recommended lifestyle interventions, dietary effects on glycemic control -Counseled on s/sx of and management of hypoglycemia -Next A1C anticipated 02/2017.   ASCVD risk - primary prevention in patient with DM. Last LDL is not controlled. ASCVD risk score cannot be calculated. At least moderate intensity statin indicated.  - Will address at future encounter  Written patient instructions provided.  Total time in face to face counseling 30 minutes.   Follow up PCP Clinic Visit in 2 weeks.     Patient seen with: Leanne Chang, PharmD Candidate [employer] Class of 2021  Butch Penny, PharmD, CPP Clinical Pharmacist [facility] [phone]

## 2020-07-02 NOTE — Progress Notes (Signed)
S:    PCP: [name] PMH: HTN, T2DM, HLD, GERD, diverticulitis   Patient arrives in good spirits. Presents for diabetes management at the request of PA [name]. Patient was seen and referred by her on 06/02/2020. Labs were drawn (06/02/20) and A1c was 11.4%.  Reports today that he is having leg pain but otherwise feeling well. He states he ran out of Glimepiride yesterday so he was unable to take it this morning. Pt reports he has been adherent to all medications (HTN, HLD, DM,GERD). Endorses he had breakfast on his way to the clinic which consisted of liver pudding and eggs.    Family/Social History:  - FH:  Mother and Maternal grandmother: HTN  -Tobacco: States he hasn't purchased a pack in over a month and smokes only when he gets a craving. Endorses smoking approximately 1 cigarette every other day  - Alcohol: 2-3 beers and 2-3 shots when he goes out on Friday. States he knows he needs to be attentive to drive so he limits himself.   Insurance coverage/medication affordability:  -Self-pay; [facility] pharmacy; states this should change in the future because he has a new job.   Current diabetes medications include:   - Glimepiride 4 mg tab - Take 1 tab PO daily - states he doesn't miss doses and takes this every morning before breakfast.   - Metformin 500 mg tab - Take 2 tabs (1000 mg) PO BID w/ meals - states he doesn't miss doses   - Insulin glargine (LANTUS) 100 UNIT/ML Solostar Pen; Inject 20  Units into the skin daily at 10 pm. Doesn't recall missing a dose but states "I might have missed one"   Patient denies hypoglycemic events, "except 1 time it was in the 70s"  Endorses the following regarding his home CBGs:  Reported morning CBGs: 100 - 125s  Reported post-prandial CBGs: 204 - 270s  Patient reported dietary habits: Endorses he stopped eating bread in the morning because he noticed his sugars were high. But admits to bread being his biggest weakness. Denies sweets, and sodas  states he drinks seltzer water with crystal light.   Patient-reported exercise habits:  Endorses he wants to exercise more but his allergies have kept him from exercising. States He gets approximately 6000 steps a day when he walks his dog for 30 minutes.  Patient denies polyuria, polydipsia, and polyphagia.  Patient denies neuropathy.  O:  Today in clinic his BG was 282 mg/dL.  Lab Results  Component Value Date   HGBA1C 11.4 (A) 06/02/2020   There were no vitals filed for this visit.  Lipid Panel     Component Value Date/Time   CHOL 186 06/02/2020 1026   TRIG 133 06/02/2020 1026   HDL 43 06/02/2020 1026   CHOLHDL 4.3 06/02/2020 1026   CHOLHDL 3.6 08/27/2010 0521   VLDL 50 (H) 08/27/2010 0521   LDLCALC 119 (H) 06/02/2020 1026   Clinical ASCVD: No   A/P: Diabetes longstanding currently uncontrolled based on A1c of 11.4. Pt verbalized he was adherent to his current medication regimen and has no objections to switching therapy today. He is still motivated to improve lifestyle. Reinforced appropriate glycemic goals. Per patient's provider GERD is controlled. This is validated by patient today. Of note, he has been seen in the ED with an alcohol- induced acute pancreatitis. Pt has decreased alcohol intake and has not experienced pancreatitis since. Discussed plan with patient about increasing insulin or adding a GLP1-RA agent. GLP1-RA will provide weight  loss, cardiovascular, and renal benefits. Pt has been counseled on ADEs and knows to report any symptoms at next visit. If pt has any contraindications to dulaglutide, discontinue and titrate insulin.   -Discontinue Glimepiride 4 mg tab - Take 1 PO daily  -Continue Metformin 500 mg tab - Take 2 tabs PO BID w/ meals  -Lantus 100 unit/ml - Inject 20 units subq daily in the morning - Initiate dulaglutide 0.75mg /0.5 mL - Inject 0.75 mg into the skin once a week.   Next anticipated A1c is 08/2020.   Written patient instructions  provided.  Total time in face to face counseling 30 minutes.  Follow up in clinic with [facility] in 3 weeks.      Patient seen with: [place] Idara Woodside, PharmD Candidate [employer]  Class of 2023  Butch Penny, PharmD, CPP Clinical Pharmacist [facility] [phone]

## 2020-07-02 NOTE — Patient Instructions (Signed)
Thank you for coming to see me today. Please do the following:  1. Continue Lantus 20 units daily. Start taking in the morning.  2. Start Trulicity. Take one injection once a week on the same day and time.  3. Continue metformin, 2 tablets twice a day.  4. Continue checking blood sugars at home.  5. Continue making the lifestyle changes we've discussed together during our visit. Diet and exercise play a significant role in improving your blood sugars.  6. Follow-up with me in 1-2 weeks.     Hypoglycemia or low blood sugar:   Low blood sugar can happen quickly and may become an emergency if not treated right away.   While this shouldn't happen often, it can be brought upon if you skip a meal or do not eat enough. Also, if your insulin or other diabetes medications are dosed too high, this can cause your blood sugar to go to low.   Warning signs of low blood sugar include: 1. Feeling shaky or dizzy 2. Feeling weak or tired  3. Excessive hunger 4. Feeling anxious or upset  5. Sweating even when you aren't exercising  What to do if I experience low blood sugar? 1. Check your blood sugar with your meter. If lower than 70, proceed to step 2.  2. Treat with 3-4 glucose tablets or 3 packets of regular sugar. If these aren't around, you can try hard candy. Yet another option would be to drink 4 ounces of fruit juice or 6 ounces of REGULAR soda.  3. Re-check your sugar in 15 minutes. If it is still below 70, do what you did in step 2 again. If has come back up, go ahead and eat a snack or small meal at this time.

## 2020-07-03 ENCOUNTER — Other Ambulatory Visit: Payer: Self-pay

## 2020-07-15 ENCOUNTER — Other Ambulatory Visit: Payer: Self-pay

## 2020-07-22 ENCOUNTER — Other Ambulatory Visit: Payer: Self-pay

## 2020-07-29 ENCOUNTER — Ambulatory Visit: Payer: Self-pay | Attending: Nurse Practitioner | Admitting: Pharmacist

## 2020-07-29 ENCOUNTER — Encounter: Payer: Self-pay | Admitting: Pharmacist

## 2020-07-29 ENCOUNTER — Other Ambulatory Visit: Payer: Self-pay

## 2020-07-29 ENCOUNTER — Other Ambulatory Visit: Payer: Self-pay | Admitting: Family Medicine

## 2020-07-29 DIAGNOSIS — E1165 Type 2 diabetes mellitus with hyperglycemia: Secondary | ICD-10-CM

## 2020-07-29 LAB — GLUCOSE, POCT (MANUAL RESULT ENTRY): POC Glucose: 294 mg/dl — AB (ref 70–99)

## 2020-07-29 MED ORDER — TRULICITY 1.5 MG/0.5ML ~~LOC~~ SOAJ
1.5000 mg | SUBCUTANEOUS | 2 refills | Status: DC
  Filled 2020-07-29: qty 2, 28d supply, fill #0

## 2020-07-29 NOTE — Progress Notes (Signed)
    S:    PCP: [name] PMH: HTN, T2DM, HLD, GERD, diverticulitis  Patient arrives in good spiritis.  Presents for diabetes evaluation, education, and management. Patient was referred on 06/02/20. Patient was last seen by CPP on 07/02/20.On this visit, Trulicity (dulaglutide) 0.75mg  was initiated into treatment plan and Glimepiride 4mg  was discontinued.  Family/Social History:  HTN (mother and grandmother)  Insurance coverage/medication affordability:  Self-pay, [facility] pharmacy  Medication adherence reported with Trulicity and metformin. He ran out of Lantus over the weekend.  Current diabetes medications include: Metformin 500mg  - takes 2 tabs BID, Lantus 20 units daily, Trulicity 0.75mg  once weekly  Current hypertension medications include: Amlodipine 10mg   Current hyperlipidemia medications include: Atorvastatin 40mg    Patient denies hypoglycemic events.  Patient reported dietary habits: endorses some dietary indiscretion. Stops eating after 8pm and has switched to "low-carb/low sugar" options to satisfy nighttime cravings.  Patient-reported exercise habits: walks dogs around neighborhood 2-3 times daily   Patient denies polyuria, polydipsia, and polyphagia. Patient denies neuropathy.  O:  POCT:  Lab Results  Component Value Date   HGBA1C 11.4 (A) 06/02/2020   There were no vitals filed for this visit.  Lipid Panel     Component Value Date/Time   CHOL 186 06/02/2020 1026   TRIG 133 06/02/2020 1026   HDL 43 06/02/2020 1026   CHOLHDL 4.3 06/02/2020 1026   CHOLHDL 3.6 08/27/2010 0521   VLDL 50 (H) 08/27/2010 0521   LDLCALC 119 (H) 06/02/2020 1026    Home fasting blood sugars: 100-125, reports some readings being lower  2 hour post-meal/random blood sugars: 204-270, says it's really dependent on when/what he eats.  Clinical Atherosclerotic Cardiovascular Disease (ASCVD): No  The 10-year ASCVD risk score 10/28/2010 DC Jr., et al., 2013) is: 15.3%   Values used to  calculate the score:     Age: 40 years     Sex: Male     Is Non-Hispanic African American: Yes     Diabetic: Yes     Tobacco smoker: No     Systolic Blood Pressure: 159 mmHg     Is BP treated: Yes     HDL Cholesterol: 43 mg/dL     Total Cholesterol: 186 mg/dL   A/P: Diabetes longstanding currently uncontrolled. Patient is able to verbalize appropriate hypoglycemia management plan. Medication adherence appears good. Control is suboptimal due to being out of insulin since Saturday and some dietary indiscretion. -Continued basal insulin Lantus (insulin glargine) 20 units daily.  -Increased dose of GLP-1 Trulicity (generic nameDulaglutide) to 1.5mg .  -Continue Metformin 500mg  tablet -- take 2 tablets BID -Extensively discussed pathophysiology of diabetes, recommended lifestyle interventions, dietary effects on blood sugar control -Counseled on s/sx of and management of hypoglycemia -Next A1C anticipated 08/2020.   ASCVD risk - secondary prevention in patient with diabetes. Last LDL is not controlled. ASCVD risk score is not >20%  - high intensity statin indicated. Aspirin is not indicated.  -Continued Atorvastatin 40 mg.   Written patient instructions provided.  Total time in face to face counseling 20 minutes.   Follow up Pharmacist/PCP Clinic Visit in 1 month.     Patient seen with:  2014 PharmD/MBA Candidate [employer] Class of 2023  Friday, PharmD, [place], CPP Clinical Pharmacist [facility] [place] [phone]

## 2020-07-30 ENCOUNTER — Other Ambulatory Visit: Payer: Self-pay

## 2020-07-31 ENCOUNTER — Other Ambulatory Visit: Payer: Self-pay

## 2020-08-07 ENCOUNTER — Other Ambulatory Visit: Payer: Self-pay

## 2020-08-08 ENCOUNTER — Other Ambulatory Visit: Payer: Self-pay | Admitting: Family Medicine

## 2020-08-08 ENCOUNTER — Other Ambulatory Visit: Payer: Self-pay

## 2020-08-08 MED ORDER — HYDROXYZINE HCL 50 MG PO TABS
ORAL_TABLET | ORAL | 0 refills | Status: DC
  Filled 2020-08-08: qty 90, 30d supply, fill #0

## 2020-08-08 NOTE — Telephone Encounter (Signed)
  Notes to clinic:  Review for refill Patient has follow up on 09/01/2020    Requested Prescriptions  Pending Prescriptions Disp Refills   hydrOXYzine (ATARAX/VISTARIL) 50 MG tablet 90 tablet 0      Ear, Nose, and Throat:  Antihistamines Passed - 08/08/2020  2:04 PM      Passed - Valid encounter within last 12 months    Recent Outpatient Visits           1 week ago Type 2 diabetes mellitus with hyperglycemia, without long-term current use of insulin [facility])   [place] [facility] [place], Jeannett Senior L, RPH-CPP   1 month ago Hyperlipidemia associated with type 2 diabetes mellitus [facility])   [facility] Lois Huxley, Cornelius Moras, RPH-CPP   6 months ago History of COVID-19   [address] [facility] [address], [name], [place]   9 months ago Essential hypertension   [facility] [place], Shea Stakes, NP   10 months ago Essential hypertension   [facility] Malcolm, Shea Stakes, NP       Future Appointments             In 3 weeks Claiborne Rigg, NP [facility]

## 2020-08-11 ENCOUNTER — Other Ambulatory Visit: Payer: Self-pay

## 2020-08-15 ENCOUNTER — Other Ambulatory Visit: Payer: Self-pay

## 2020-08-19 ENCOUNTER — Other Ambulatory Visit: Payer: Self-pay

## 2020-08-29 ENCOUNTER — Other Ambulatory Visit: Payer: Self-pay

## 2020-08-29 ENCOUNTER — Other Ambulatory Visit: Payer: Self-pay | Admitting: Nurse Practitioner

## 2020-08-29 DIAGNOSIS — E1165 Type 2 diabetes mellitus with hyperglycemia: Secondary | ICD-10-CM

## 2020-08-29 MED ORDER — INSULIN GLARGINE 100 UNIT/ML SOLOSTAR PEN
20.0000 [IU] | PEN_INJECTOR | Freq: Every day | SUBCUTANEOUS | 1 refills | Status: DC
  Filled 2020-08-29: qty 6, 30d supply, fill #0
  Filled 2020-10-07: qty 6, 30d supply, fill #1

## 2020-08-29 MED ORDER — METFORMIN HCL 500 MG PO TABS
1000.0000 mg | ORAL_TABLET | Freq: Two times a day (BID) | ORAL | 1 refills | Status: DC
  Filled 2020-08-29: qty 120, 30d supply, fill #0
  Filled 2020-10-07: qty 120, 30d supply, fill #1

## 2020-08-29 MED ORDER — TRULICITY 1.5 MG/0.5ML ~~LOC~~ SOAJ
1.5000 mg | SUBCUTANEOUS | 1 refills | Status: DC
  Filled 2020-08-29: qty 2, 28d supply, fill #0
  Filled 2020-09-25: qty 2, 28d supply, fill #1

## 2020-08-29 MED ORDER — HYDROXYZINE HCL 50 MG PO TABS
ORAL_TABLET | ORAL | 0 refills | Status: DC
Start: 2020-08-29 — End: 2020-10-07
  Filled 2020-08-29: qty 90, 18d supply, fill #0

## 2020-08-29 NOTE — Telephone Encounter (Signed)
Medication: Rx #: [id number] Dulaglutide (TRULICITY) 1.5 MG/0.5ML SOPN [[id number]] , Rx #: [id number] insulin glargine (LANTUS) 100 UNIT/ML Solostar Pen , [id number] , Rx #: [id number] metFORMIN (GLUCOPHAGE) 500 MG tablet [[id number]] , Rx #: [id number] hydrOXYzine (ATARAX/VISTARIL) 50 MG tablet [[id number]]   Pt reports that he is  out of lantus and Trulicity   Has the patient contacted their pharmacy? YES (Agent: If no, request that the patient contact the pharmacy for the refill.) (Agent: If yes, when and what did the pharmacy advise?)  Preferred Pharmacy (with phone number or street name): [facility] [address] [place] [address] Phone: [phone] Fax: [phone] Hours: M-F 8:30a-5:30p    Agent: Please be advised that RX refills may take up to 3 business days. We ask that you follow-up with your pharmacy.

## 2020-08-29 NOTE — Telephone Encounter (Signed)
Notes to clinic:  Patient has appointment on 09/01/2020 States he is out of 2 medications Should have refills Attempted to contact pharmacy was unable to reach    Requested Prescriptions  Pending Prescriptions Disp Refills   metFORMIN (GLUCOPHAGE) 500 MG tablet 120 tablet 2    Sig: Take 2 tablets (1,000 mg total) by mouth 2 (two) times daily with a meal.      Endocrinology:  Diabetes - Biguanides Failed - 08/29/2020 12:48 PM      Failed - HBA1C is between 0 and 7.9 and within 180 days    Hemoglobin A1C  Date Value Ref Range Status  06/02/2020 11.4 (A) 4.0 - 5.6 % Final   HbA1c, POC (controlled diabetic range)  Date Value Ref Range Status  11/17/2017 13.2 (A) 0.0 - 7.0 % Final   Hgb A1c MFr Bld  Date Value Ref Range Status  10/11/2019 11.6 (H) 4.8 - 5.6 % Final    Comment:             Prediabetes: 5.7 - 6.4          Diabetes: >6.4          Glycemic control for adults with diabetes: <7.0           Passed - Cr in normal range and within 360 days    Creatinine, Ser  Date Value Ref Range Status  06/02/2020 0.87 0.76 - 1.27 mg/dL Final   Creatinine,U  Date Value Ref Range Status  08/27/2010 136.7 mg/dL Final    Comment:    (NOTE) Cutoff Values for Urine Drug Screen:        Drug Class           Cutoff (ng/mL)        Amphetamines            1000        Barbiturates             200        Cocaine Metabolites      300        Benzodiazepines          200        Methadone                300        Opiates                 2000        Phencyclidine             25        Propoxyphene             300        Marijuana Metabolites     50 For medical purposes only.          Passed - AA eGFR in normal range and within 360 days    GFR calc Af Amer  Date Value Ref Range Status  10/11/2019 119 >59 mL/min/1.73 Final    Comment:    **[facility] currently reports eGFR in compliance with the current**   recommendations of the [employer]. [facility] will   update  reporting as new guidelines are published from the NKF-ASN   Task force.    GFR calc non Af Amer  Date Value Ref Range Status  10/11/2019 103 >59 mL/min/1.73 Final   eGFR  Date Value Ref Range Status  06/02/2020 111 >59 mL/min/1.73 Final  Passed - Valid encounter within last 6 months    Recent Outpatient Visits           1 month ago Type 2 diabetes mellitus with hyperglycemia, without long-term current use of insulin (HCC)   [facility] [facility] Van Ausdall, Stephen L, RPH-CPP   1 month ago Hyperlipidemia associated with type 2 diabetes mellitus (HCC)   [place] [facility] Van Ausdall, Stephen L, RPH-CPP   7 months ago History of COVID-19   [place] [facility] [name], [name], PA-C   9 months ago Essential hypertension   [place] [facility] Fleming, [name] W, NP   10 months ago Essential hypertension   [place] [facility] Fleming, [name] W, NP       Future Appointments             In 3 days Fleming, [name] W, NP [place] [facility]               Dulaglutide (TRULICITY) 1.5 MG/0.5ML SOPN 2 mL 2    Sig: Inject 1.5 mg into the skin once a week.      Endocrinology:  Diabetes - GLP-1 Receptor Agonists Failed - 08/29/2020 12:48 PM      Failed - HBA1C is between 0 and 7.9 and within 180 days    Hemoglobin A1C  Date Value Ref Range Status  06/02/2020 11.4 (A) 4.0 - 5.6 % Final   HbA1c, POC (controlled diabetic range)  Date Value Ref Range Status  11/17/2017 13.2 (A) 0.0 - 7.0 % Final   Hgb A1c MFr Bld  Date Value Ref Range Status  10/11/2019 11.6 (H) 4.8 - 5.6 % Final    Comment:             Prediabetes: 5.7 - 6.4          Diabetes: >6.4          Glycemic control for adults with diabetes: <7.0           Passed - Valid encounter within last 6 months    Recent Outpatient Visits           1 month ago Type 2 diabetes mellitus  with hyperglycemia, without long-term current use of insulin (HCC)   [place] [facility] Van Ausdall, Stephen L, RPH-CPP   1 month ago Hyperlipidemia associated with type 2 diabetes mellitus (HCC)   [place] [facility] Van Ausdall, Stephen L, RPH-CPP   7 months ago History of COVID-19   [place] [facility] [name], [name], PA-C   9 months ago Essential hypertension   [place] [facility] Fleming, [name] W, NP   10 months ago Essential hypertension   [place] [facility] Fleming, [name] W, NP       Future Appointments             In 3 days Fleming, [name] W, NP [place] [facility]               hydrOXYzine (ATARAX/VISTARIL) 50 MG tablet 90 tablet 0    Sig: TAKE 1 TABLET (50 MG TOTAL) BY MOUTH EVERY 8 (EIGHT) HOURS AS NEEDED FOR ANXIETY. AND 1-2 TABLETS BY MOUTH AT BEDTIME      Ear, Nose, and Throat:  Antihistamines Passed - 08/29/2020 12:48 PM        Passed - Valid encounter within last 12 months    Recent Outpatient Visits           1 month ago Type 2 diabetes mellitus with hyperglycemia, without long-term current use of insulin (HCC)   [place] [facility] Van Ausdall, Stephen L, RPH-CPP   1 month ago Hyperlipidemia associated with type 2 diabetes mellitus (HCC)   [place] [facility] Van Ausdall, Stephen L, RPH-CPP   7 months ago History of COVID-19   [place] [facility] [name], [name], PA-C   9 months ago Essential hypertension   [place] [facility] Fleming, [name] W, NP   10 months ago Essential hypertension   [place] [facility] Fleming, [name] W, NP       Future Appointments             In 3 days Fleming, [name] W, NP [place] [facility]               insulin glargine (LANTUS) 100 UNIT/ML Solostar Pen 6 mL 2     Sig: Inject 20 Units into the skin daily at 10 pm.      Endocrinology:  Diabetes - Insulins Failed - 08/29/2020 12:48 PM      Failed - HBA1C is between 0 and 7.9 and within 180 days    Hemoglobin A1C  Date Value Ref Range Status  06/02/2020 11.4 (A) 4.0 - 5.6 % Final   HbA1c, POC (controlled diabetic range)  Date Value Ref Range Status  11/17/2017 13.2 (A) 0.0 - 7.0 % Final   Hgb A1c MFr Bld  Date Value Ref Range Status  10/11/2019 11.6 (H) 4.8 - 5.6 % Final    Comment:             Prediabetes: 5.7 - 6.4          Diabetes: >6.4          Glycemic control for adults with diabetes: <7.0           Passed - Valid encounter within last 6 months    Recent Outpatient Visits           1 month ago Type 2 diabetes mellitus with hyperglycemia, without long-term current use of insulin (HCC)   [place] [facility] Van Ausdall, Stephen L, RPH-CPP   1 month ago Hyperlipidemia associated with type 2 diabetes mellitus (HCC)   [place] [facility] Van Ausdall, Stephen L, RPH-CPP   7 months ago History of COVID-19   [place] [facility] [name], [name], PA-C   9 months ago Essential hypertension   [place] [facility] Fleming, [name] W, NP   10 months ago Essential hypertension   [place] [facility] Fleming, [name] W, NP       Future Appointments             In 3 days Fleming, [name] W, NP [place] [facility]                   

## 2020-09-01 ENCOUNTER — Encounter: Payer: Self-pay | Admitting: Nurse Practitioner

## 2020-09-01 ENCOUNTER — Ambulatory Visit: Payer: Self-pay | Attending: Nurse Practitioner | Admitting: Nurse Practitioner

## 2020-09-01 ENCOUNTER — Other Ambulatory Visit: Payer: Self-pay

## 2020-09-01 VITALS — BP 148/106 | HR 98 | Resp 16 | Ht 65.0 in | Wt 249.0 lb

## 2020-09-01 DIAGNOSIS — E1169 Type 2 diabetes mellitus with other specified complication: Secondary | ICD-10-CM

## 2020-09-01 DIAGNOSIS — E785 Hyperlipidemia, unspecified: Secondary | ICD-10-CM

## 2020-09-01 DIAGNOSIS — Z794 Long term (current) use of insulin: Secondary | ICD-10-CM

## 2020-09-01 DIAGNOSIS — I10 Essential (primary) hypertension: Secondary | ICD-10-CM

## 2020-09-01 DIAGNOSIS — E1165 Type 2 diabetes mellitus with hyperglycemia: Secondary | ICD-10-CM

## 2020-09-01 DIAGNOSIS — J302 Other seasonal allergic rhinitis: Secondary | ICD-10-CM

## 2020-09-01 DIAGNOSIS — N521 Erectile dysfunction due to diseases classified elsewhere: Secondary | ICD-10-CM

## 2020-09-01 LAB — POCT GLYCOSYLATED HEMOGLOBIN (HGB A1C): HbA1c, POC (controlled diabetic range): 9.8 % — AB (ref 0.0–7.0)

## 2020-09-01 LAB — GLUCOSE, POCT (MANUAL RESULT ENTRY): POC Glucose: 229 mg/dl — AB (ref 70–99)

## 2020-09-01 MED ORDER — ATORVASTATIN CALCIUM 40 MG PO TABS
40.0000 mg | ORAL_TABLET | Freq: Every day | ORAL | 0 refills | Status: DC
  Filled 2020-09-01: qty 30, 30d supply, fill #0
  Filled 2020-10-07: qty 30, 30d supply, fill #1
  Filled 2020-12-25 – 2021-01-02 (×2): qty 30, 30d supply, fill #2

## 2020-09-01 MED ORDER — CETIRIZINE HCL 10 MG PO TABS
10.0000 mg | ORAL_TABLET | Freq: Every day | ORAL | 1 refills | Status: DC
  Filled 2020-09-01: qty 30, 30d supply, fill #0
  Filled 2020-11-24: qty 30, 30d supply, fill #1
  Filled 2020-12-25 – 2021-01-02 (×2): qty 30, 30d supply, fill #2

## 2020-09-01 MED ORDER — SILDENAFIL CITRATE 100 MG PO TABS
50.0000 mg | ORAL_TABLET | Freq: Every day | ORAL | 11 refills | Status: DC | PRN
  Filled 2020-09-01: qty 10, 30d supply, fill #0
  Filled 2020-11-07: qty 10, 30d supply, fill #1
  Filled 2020-12-25 – 2021-01-02 (×2): qty 10, 30d supply, fill #2
  Filled 2021-02-11: qty 10, 30d supply, fill #3
  Filled 2021-03-25: qty 10, 30d supply, fill #0
  Filled 2021-06-08 – 2021-06-25 (×2): qty 10, 30d supply, fill #1

## 2020-09-01 MED ORDER — FLUTICASONE PROPIONATE 50 MCG/ACT NA SUSP
1.0000 | Freq: Every day | NASAL | 0 refills | Status: DC
  Filled 2020-09-01: qty 16, 30d supply, fill #0

## 2020-09-01 MED ORDER — LISINOPRIL 10 MG PO TABS
10.0000 mg | ORAL_TABLET | Freq: Every day | ORAL | 3 refills | Status: DC
  Filled 2020-09-01: qty 30, 30d supply, fill #0
  Filled 2020-10-07: qty 30, 30d supply, fill #1

## 2020-09-01 MED ORDER — AMLODIPINE BESYLATE 10 MG PO TABS
10.0000 mg | ORAL_TABLET | Freq: Every day | ORAL | 0 refills | Status: DC
  Filled 2020-09-01: qty 30, 30d supply, fill #0
  Filled 2020-10-07: qty 30, 30d supply, fill #1
  Filled 2020-11-24: qty 30, 30d supply, fill #2
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
## 2020-09-02 LAB — CMP14+EGFR
ALT: 30 IU/L (ref 0–44)
AST: 21 IU/L (ref 0–40)
Albumin/Globulin Ratio: 1.9 (ref 1.2–2.2)
Albumin: 4.7 g/dL (ref 4.0–5.0)
Alkaline Phosphatase: 86 IU/L (ref 44–121)
BUN/Creatinine Ratio: 16 (ref 9–20)
BUN: 14 mg/dL (ref 6–24)
Bilirubin Total: 0.4 mg/dL (ref 0.0–1.2)
CO2: 25 mmol/L (ref 20–29)
Calcium: 9.7 mg/dL (ref 8.7–10.2)
Chloride: 101 mmol/L (ref 96–106)
Creatinine, Ser: 0.89 mg/dL (ref 0.76–1.27)
Globulin, Total: 2.5 g/dL (ref 1.5–4.5)
Glucose: 218 mg/dL — ABNORMAL HIGH (ref 65–99)
Potassium: 4.7 mmol/L (ref 3.5–5.2)
Sodium: 139 mmol/L (ref 134–144)
Total Protein: 7.2 g/dL (ref 6.0–8.5)
eGFR: 110 mL/min/{1.73_m2} (ref >59)

## 2020-09-02 LAB — MICROALBUMIN / CREATININE URINE RATIO
Creatinine, Urine: 291 mg/dL
Microalb/Creat Ratio: 18 mg/g creat (ref 0–29)
Microalbumin, Urine: 52.7 ug/mL

## 2020-09-04 ENCOUNTER — Other Ambulatory Visit: Payer: Self-pay

## 2020-09-17 ENCOUNTER — Ambulatory Visit (INDEPENDENT_AMBULATORY_CARE_PROVIDER_SITE_OTHER): Payer: Self-pay | Admitting: Primary Care

## 2020-09-25 ENCOUNTER — Other Ambulatory Visit: Payer: Self-pay

## 2020-09-26 ENCOUNTER — Other Ambulatory Visit: Payer: Self-pay

## 2020-10-07 ENCOUNTER — Other Ambulatory Visit: Payer: Self-pay

## 2020-10-07 ENCOUNTER — Other Ambulatory Visit: Payer: Self-pay | Admitting: Family Medicine

## 2020-10-07 MED ORDER — HYDROXYZINE HCL 50 MG PO TABS
ORAL_TABLET | ORAL | 0 refills | Status: DC
  Filled 2020-10-07: qty 90, 18d supply, fill #0
  Filled 2020-10-24: qty 90, 30d supply, fill #0

## 2020-10-08 ENCOUNTER — Other Ambulatory Visit: Payer: Self-pay

## 2020-10-14 ENCOUNTER — Ambulatory Visit: Payer: Self-pay | Attending: Nurse Practitioner | Admitting: Pharmacist

## 2020-10-14 ENCOUNTER — Other Ambulatory Visit: Payer: Self-pay

## 2020-10-14 VITALS — BP 142/90

## 2020-10-14 DIAGNOSIS — I10 Essential (primary) hypertension: Secondary | ICD-10-CM

## 2020-10-14 DIAGNOSIS — Z794 Long term (current) use of insulin: Secondary | ICD-10-CM

## 2020-10-14 DIAGNOSIS — E1169 Type 2 diabetes mellitus with other specified complication: Secondary | ICD-10-CM

## 2020-10-14 LAB — GLUCOSE, POCT (MANUAL RESULT ENTRY): POC Glucose: 170 mg/dl — AB (ref 70–99)

## 2020-10-14 MED ORDER — TRUE METRIX METER W/DEVICE KIT
PACK | 0 refills | Status: DC
  Filled 2020-10-14: qty 1, 365d supply, fill #0

## 2020-10-14 MED ORDER — LISINOPRIL 20 MG PO TABS
20.0000 mg | ORAL_TABLET | Freq: Every day | ORAL | 2 refills | Status: DC
  Filled 2020-10-14 – 2020-11-07 (×2): qty 30, 30d supply, fill #0

## 2020-10-14 NOTE — Progress Notes (Signed)
    S:    PCP: Geryl Rankins PMH: HTN, T2DM, HLD, GERD, diverticulitis  Patient arrives in good spiritis.  Presents for diabetes evaluation, education, and management. Patient was referred on 09/01/2020.   Family/Social History:  HTN (mother and grandmother)  Insurance coverage/medication affordability:  Self-pay, [facility] pharmacy Former smoker  Denies current alcohol use   Medication adherence reported. Current hypertension medications include: Amlodipine 10 mg, lisinopril 10 mg daily   Patient reported dietary habits: endorses some dietary indiscretion. Admits to eating fried chicken, sandwiches, etc.   Patient-reported exercise habits: exercises on his bike at least 1 hour/daily  O:  Vitals:   10/14/20 0950  BP: (!) 142/90   Lipid Panel     Component Value Date/Time   CHOL 186 06/02/2020 1026   TRIG 133 06/02/2020 1026   HDL 43 06/02/2020 1026   CHOLHDL 4.3 06/02/2020 1026   CHOLHDL 3.6 08/27/2010 0521   VLDL 50 (H) 08/27/2010 0521   LDLCALC 119 (H) 06/02/2020 1026   Clinical Atherosclerotic Cardiovascular Disease (ASCVD): No  The 10-year ASCVD risk score Mikey Bussing DC Jr., et al., 2013) is: 13.3%   Values used to calculate the score:     Age: 8 years     Sex: Male     Is Non-Hispanic African American: Yes     Diabetic: Yes     Tobacco smoker: No     Systolic Blood Pressure: 076 mmHg     Is BP treated: Yes     HDL Cholesterol: 43 mg/dL     Total Cholesterol: 186 mg/dL   A/P: Hypertension - longstanding currently above goal but improving with addition of lisinopril. Will increase dose today and obtain updated labs. BP goal < 130/80 mmHg. Pt endorses medication compliance.  -Continued amlodipine 10 mg daily.  -Increase lisinopril to 20 mg daily.  -CMP14+eGFR -Counseled extensively on lifestyle and effects on HTN control.   Written patient instructions provided.  Total time in face to face counseling 20 minutes.   Follow up Pharmacist Clinic Visit in 1 month.      Benard Halsted, PharmD, [place], [place] [phone]

## 2020-10-15 LAB — CMP14+EGFR
ALT: 28 IU/L (ref 0–44)
AST: 25 IU/L (ref 0–40)
Albumin/Globulin Ratio: 1.8 (ref 1.2–2.2)
Albumin: 4.5 g/dL (ref 4.0–5.0)
Alkaline Phosphatase: 77 IU/L (ref 44–121)
BUN/Creatinine Ratio: 24 — ABNORMAL HIGH (ref 9–20)
BUN: 20 mg/dL (ref 6–24)
Bilirubin Total: 0.3 mg/dL (ref 0.0–1.2)
CO2: 23 mmol/L (ref 20–29)
Calcium: 9.7 mg/dL (ref 8.7–10.2)
Chloride: 101 mmol/L (ref 96–106)
Creatinine, Ser: 0.84 mg/dL (ref 0.76–1.27)
Globulin, Total: 2.5 g/dL (ref 1.5–4.5)
Glucose: 167 mg/dL — ABNORMAL HIGH (ref 65–99)
Potassium: 4.4 mmol/L (ref 3.5–5.2)
Sodium: 139 mmol/L (ref 134–144)
Total Protein: 7 g/dL (ref 6.0–8.5)
eGFR: 112 mL/min/{1.73_m2} (ref >59)

## 2020-10-21 ENCOUNTER — Other Ambulatory Visit: Payer: Self-pay

## 2020-10-24 ENCOUNTER — Other Ambulatory Visit: Payer: Self-pay

## 2020-10-24 ENCOUNTER — Other Ambulatory Visit: Payer: Self-pay | Admitting: Family Medicine

## 2020-10-25 MED ORDER — TRULICITY 1.5 MG/0.5ML ~~LOC~~ SOAJ
1.5000 mg | SUBCUTANEOUS | 1 refills | Status: DC
  Filled 2020-10-25: qty 2, 28d supply, fill #0

## 2020-10-25 NOTE — Telephone Encounter (Signed)
Requested Prescriptions  Pending Prescriptions Disp Refills  . Dulaglutide (TRULICITY) 1.5 MG/0.5ML SOPN 2 mL 1    Sig: Inject 1.5 mg into the skin once a week.     Endocrinology:  Diabetes - GLP-1 Receptor Agonists Failed - 10/24/2020  3:26 PM      Failed - HBA1C is between 0 and 7.9 and within 180 days    HbA1c, POC (controlled diabetic range)  Date Value Ref Range Status  09/01/2020 9.8 (A) 0.0 - 7.0 % Final         Passed - Valid encounter within last 6 months    Recent Outpatient Visits          1 week ago Type 2 diabetes mellitus with other specified complication, with long-term current use of insulin [facility])   [facility] [place], Jeannett Senior L, RPH-CPP   1 month ago Type 2 diabetes mellitus with other specified complication, with long-term current use of insulin [facility])   [facility] [place], [place] W, NP   2 months ago Type 2 diabetes mellitus with hyperglycemia, without long-term current use of insulin [facility])   [facility] [place], Jeannett Senior L, RPH-CPP   3 months ago Hyperlipidemia associated with type 2 diabetes mellitus [facility])   [facility] [place] Huxley, Cornelius Moras, RPH-CPP   9 months ago History of COVID-19   [employer] [facility] [name], [place], [place]      Future Appointments            In 2 weeks Lois Huxley, Cornelius Moras, RPH-CPP [employer] [facility]   In 1 month Claiborne Rigg, NP [facility] [facility]

## 2020-10-27 ENCOUNTER — Other Ambulatory Visit: Payer: Self-pay

## 2020-11-07 ENCOUNTER — Other Ambulatory Visit: Payer: Self-pay

## 2020-11-14 ENCOUNTER — Ambulatory Visit: Payer: Self-pay | Attending: Nurse Practitioner | Admitting: Pharmacist

## 2020-11-14 ENCOUNTER — Other Ambulatory Visit: Payer: Self-pay

## 2020-11-14 VITALS — BP 142/99

## 2020-11-14 DIAGNOSIS — I10 Essential (primary) hypertension: Secondary | ICD-10-CM

## 2020-11-14 DIAGNOSIS — E1165 Type 2 diabetes mellitus with hyperglycemia: Secondary | ICD-10-CM

## 2020-11-14 LAB — GLUCOSE, POCT (MANUAL RESULT ENTRY): POC Glucose: 295 mg/dl — AB (ref 70–99)

## 2020-11-14 MED ORDER — LOSARTAN POTASSIUM 25 MG PO TABS
25.0000 mg | ORAL_TABLET | Freq: Every day | ORAL | 2 refills | Status: DC
  Filled 2020-11-14: qty 30, 30d supply, fill #0
  Filled 2020-12-25 – 2021-01-02 (×2): qty 30, 30d supply, fill #1
  Filled 2021-02-11: qty 30, 30d supply, fill #2

## 2020-11-14 MED ORDER — TRULICITY 3 MG/0.5ML ~~LOC~~ SOAJ
3.0000 mg | SUBCUTANEOUS | 2 refills | Status: DC
  Filled 2020-11-14: qty 2, 28d supply, fill #0

## 2020-11-14 NOTE — Progress Notes (Signed)
S:    PCP: [name] PMH: HTN, T2DM, HLD, GERD, diverticulitis  Patient arrives in good spiritis.  Presents for diabetes and hypertension evaluation, education, and management. Patient was last seen by CPP on 10/14/2020. At this visit, lisinopril was increased to 20 mg daily. At a previous CPP visit on 07/29/2020, Trulicity was increased to 1.5 mg.  Today patient reports tolerating the medications well except for the increased dose of lisinopril, which he reports made him "feel bad and made my heart race." He denies HA/dizziness/chest pain/dyspnea/N/V/D. Patient reports home CBGs in the 200's and denies <70, but endorsed a hypoglycemic event.  Family/Social History:  HTN (mother and grandmother)  Insurance coverage/medication affordability:  Self-pay, [facility] pharmacy  Medication adherence reported with all medications except lisinopril. His last refill of Trulicity was delayed by ~5 days due to coverage issues, but he was able to restart it and get back on track. Current diabetes medications include: Metformin 500mg  2 tabs BID, Lantus 20 units daily, Trulicity 1.5 mg weekly  Current hypertension medications include: Amlodipine 10mg  and lisinopril 20 mg (not taking) Current hyperlipidemia medications include: Atorvastatin 40mg    Patient endorses one episode of symptoms of "hand tremors and feeling of doom" but not having his glucometer to check his CBG. He reports drinking a soda and eating which helped him feel better.  Patient reported dietary habits: endorses some dietary indiscretion. He reports trying to reduce carbs, but still eats cereal and noodles.  Patient-reported exercise habits: walks and rides stationary bike about 3 times a week for ~15 mins.   Patient denies polyuria, polydipsia, and polyphagia. Patient denies neuropathy.  O:  POCT:   Vitals:   11/14/20 1004  BP: (!) 142/99    Lab Results  Component Value Date   HGBA1C 9.8 (A) 09/01/2020   There were no  vitals filed for this visit.  Lipid Panel     Component Value Date/Time   CHOL 186 06/02/2020 1026   TRIG 133 06/02/2020 1026   HDL 43 06/02/2020 1026   CHOLHDL 4.3 06/02/2020 1026   CHOLHDL 3.6 08/27/2010 0521   VLDL 50 (H) 08/27/2010 0521   LDLCALC 119 (H) 06/02/2020 1026    Clinical Atherosclerotic Cardiovascular Disease (ASCVD): No  The 10-year ASCVD risk score (Arnett DK, et al., 2019) is: 13.3%   Values used to calculate the score:     Age: 40 years     Sex: Male     Is Non-Hispanic African American: Yes     Diabetic: Yes     Tobacco smoker: No     Systolic Blood Pressure: 142 mmHg     Is BP treated: Yes     HDL Cholesterol: 43 mg/dL     Total Cholesterol: 186 mg/dL   A/P: Diabetes longstanding currently uncontrolled. Patient is able to verbalize appropriate hypoglycemia management plan. Medication adherence appears good. Control is suboptimal due to dietary indiscretion. -Continued basal insulin Lantus (insulin glargine) 20 units daily.  -Increased dose of GLP-1 Trulicity to 3.0 mg.  -Continue Metformin 500mg  tablet --  2 tablets BID -Extensively discussed pathophysiology of diabetes, recommended lifestyle interventions, dietary effects on blood sugar control -Counseled on s/sx of and management of hypoglycemia -Next A1C anticipated 11/2020   ASCVD risk - secondary prevention in patient with diabetes. Last LDL is not controlled. ASCVD risk score is not >20%  - high intensity statin indicated. Aspirin is not indicated.  -Continued Atorvastatin 40 mg.  -Recommend follow-up lipid.   Hypertension longstanding and  not at goal. Goal = <130/80. Patient did not tolerate increased dose of lisinopril. Will initiate losartan and advised patient to take at night and call if he has any problems tolerating. -Start losartan 25 mg daily. -BMP at month  follow-up.  Written patient instructions provided.  Total time in face to face counseling 30 minutes.  Follow up PCP Clinic Visit  in 11/2020.     Patient seen with: Duane Lope, PharmD PGY-1 Community Resident [facility]

## 2020-11-19 ENCOUNTER — Other Ambulatory Visit: Payer: Self-pay

## 2020-11-24 ENCOUNTER — Other Ambulatory Visit: Payer: Self-pay

## 2020-11-24 ENCOUNTER — Other Ambulatory Visit: Payer: Self-pay | Admitting: Family Medicine

## 2020-11-24 DIAGNOSIS — E1165 Type 2 diabetes mellitus with hyperglycemia: Secondary | ICD-10-CM

## 2020-11-25 ENCOUNTER — Other Ambulatory Visit: Payer: Self-pay

## 2020-11-25 MED ORDER — METFORMIN HCL 500 MG PO TABS
1000.0000 mg | ORAL_TABLET | Freq: Two times a day (BID) | ORAL | 0 refills | Status: DC
  Filled 2020-11-25: qty 120, 30d supply, fill #0

## 2020-12-02 ENCOUNTER — Encounter: Payer: Self-pay | Admitting: Nurse Practitioner

## 2020-12-02 ENCOUNTER — Other Ambulatory Visit: Payer: Self-pay

## 2020-12-02 ENCOUNTER — Telehealth: Payer: Self-pay | Admitting: Nurse Practitioner

## 2020-12-02 ENCOUNTER — Ambulatory Visit: Payer: Self-pay | Attending: Nurse Practitioner | Admitting: Nurse Practitioner

## 2020-12-02 DIAGNOSIS — E785 Hyperlipidemia, unspecified: Secondary | ICD-10-CM

## 2020-12-02 DIAGNOSIS — I10 Essential (primary) hypertension: Secondary | ICD-10-CM

## 2020-12-02 DIAGNOSIS — Z794 Long term (current) use of insulin: Secondary | ICD-10-CM

## 2020-12-02 DIAGNOSIS — E1169 Type 2 diabetes mellitus with other specified complication: Secondary | ICD-10-CM

## 2020-12-02 DIAGNOSIS — E1165 Type 2 diabetes mellitus with hyperglycemia: Secondary | ICD-10-CM

## 2020-12-02 MED ORDER — INSULIN GLARGINE 100 UNIT/ML SOLOSTAR PEN
20.0000 [IU] | PEN_INJECTOR | Freq: Every day | SUBCUTANEOUS | 1 refills | Status: DC
  Filled 2020-12-02 – 2020-12-22 (×3): qty 6, 30d supply, fill #0
  Filled 2021-02-11: qty 6, 30d supply, fill #1

## 2020-12-02 MED ORDER — TRULICITY 3 MG/0.5ML ~~LOC~~ SOAJ
3.0000 mg | SUBCUTANEOUS | 2 refills | Status: DC
Start: 2020-12-02 — End: 2021-01-22
  Filled 2020-12-02 – 2020-12-22 (×3): qty 2, 28d supply, fill #0

## 2020-12-02 NOTE — Telephone Encounter (Signed)
[phone] No answer. LVM
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
## 2020-12-06 IMAGING — CR DG ELBOW COMPLETE 3+V*R*
4 series · 4 of 4 positions shown · non-contrast
Comparison: None.

CLINICAL DATA: Diffuse right elbow pain for the past week. No known
injury.

EXAM:
RIGHT ELBOW - COMPLETE 3+ VIEW

[x elbow ap right]
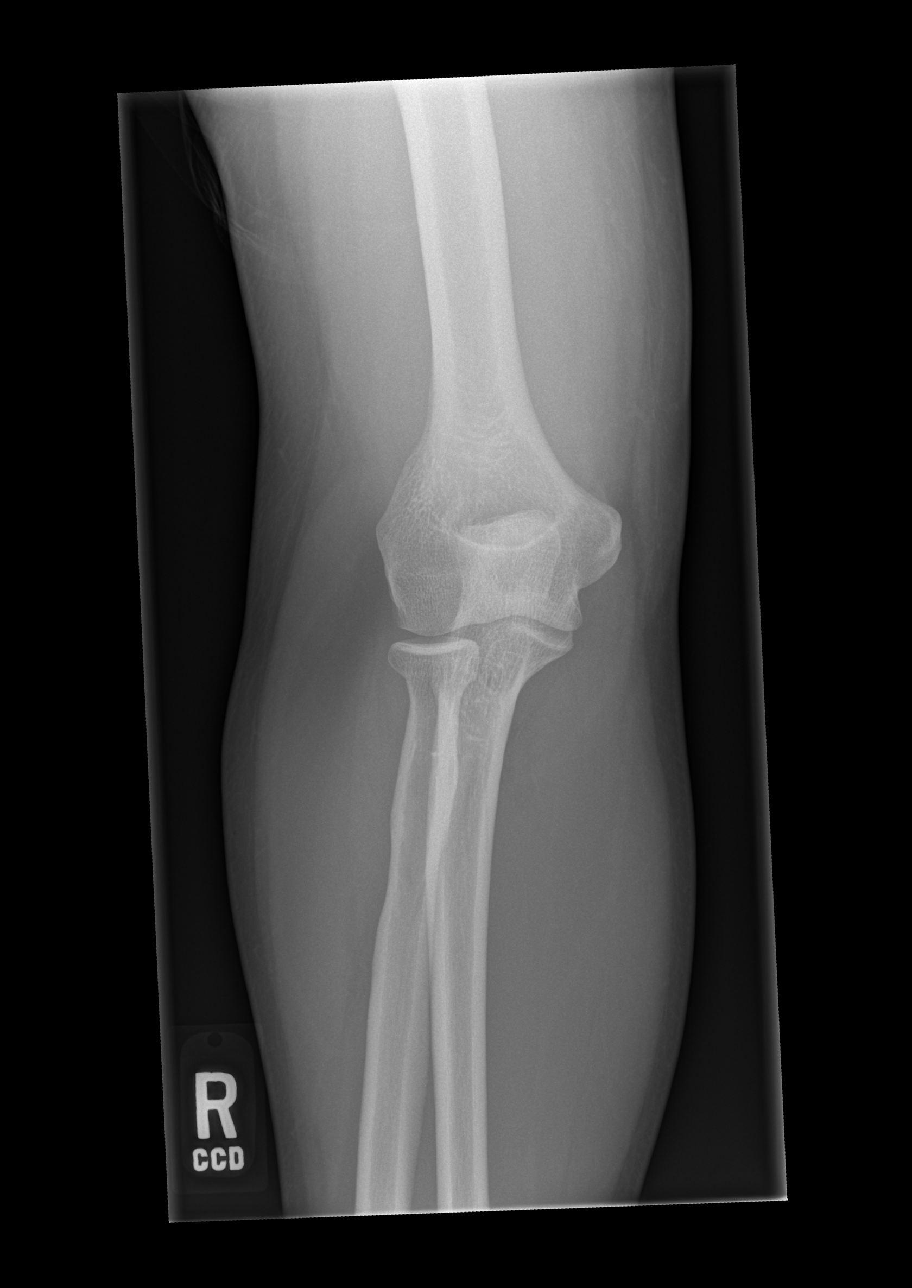

[x elbow obl right (1 of 2)]
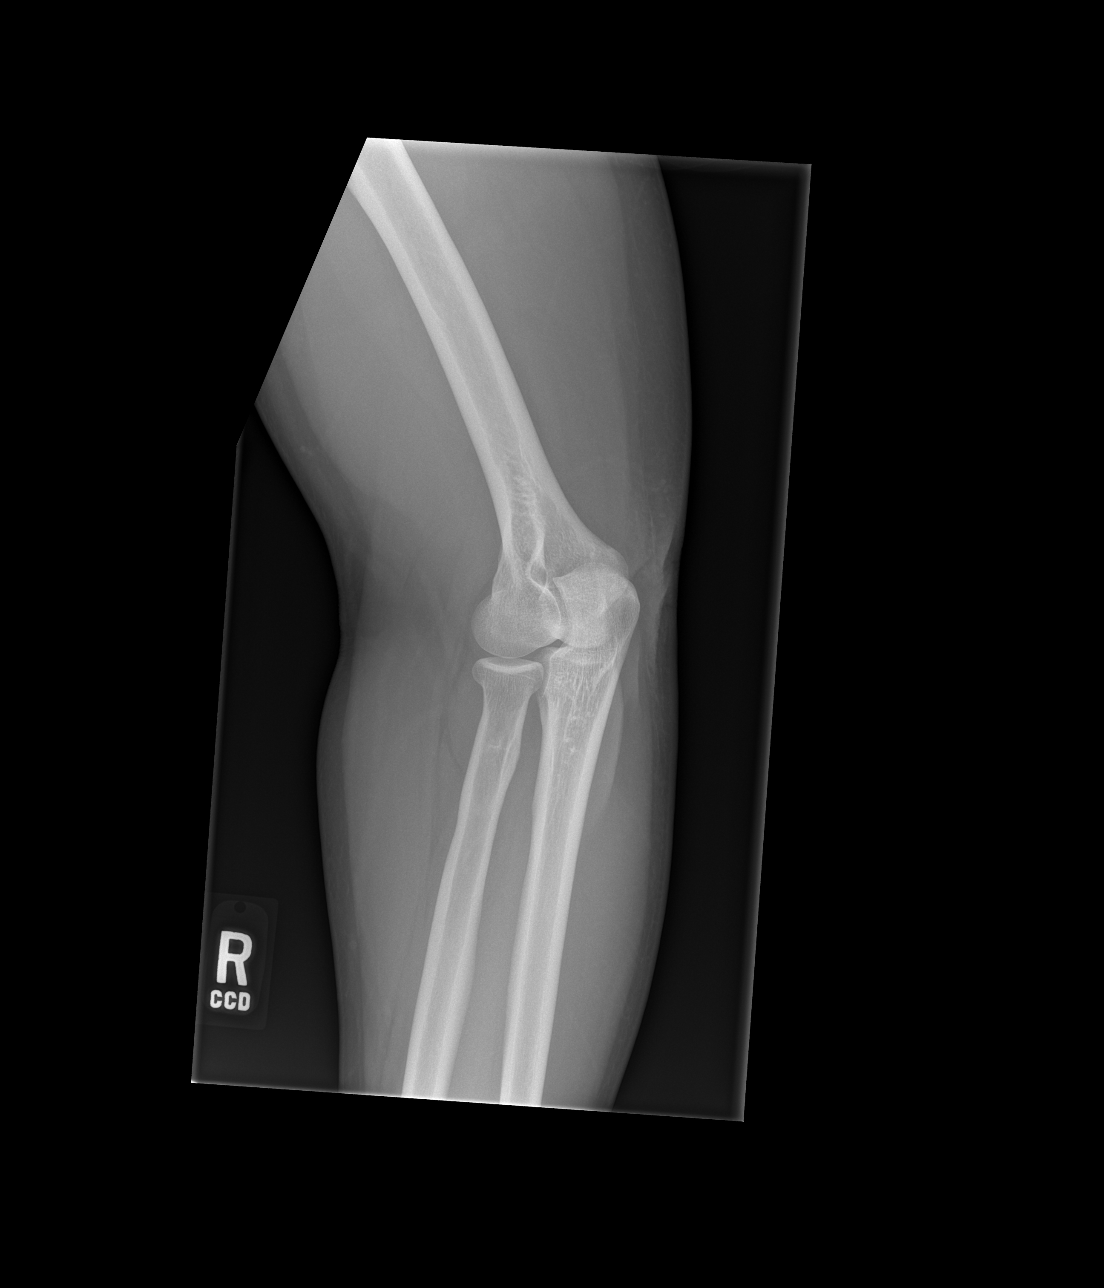

[x elbow obl right (2 of 2)]
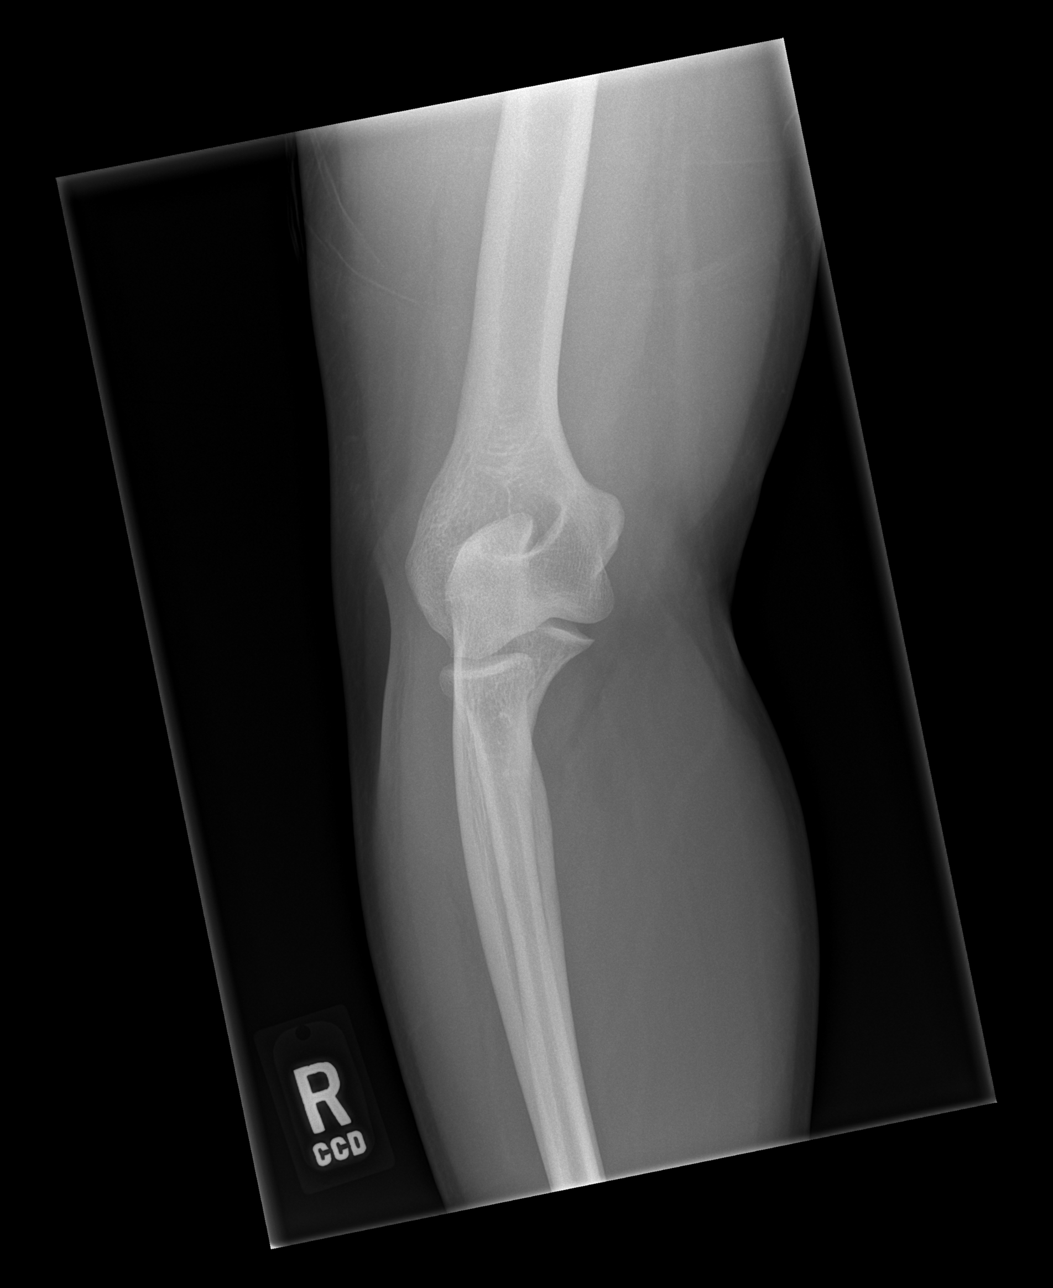

[x elbow lat right]
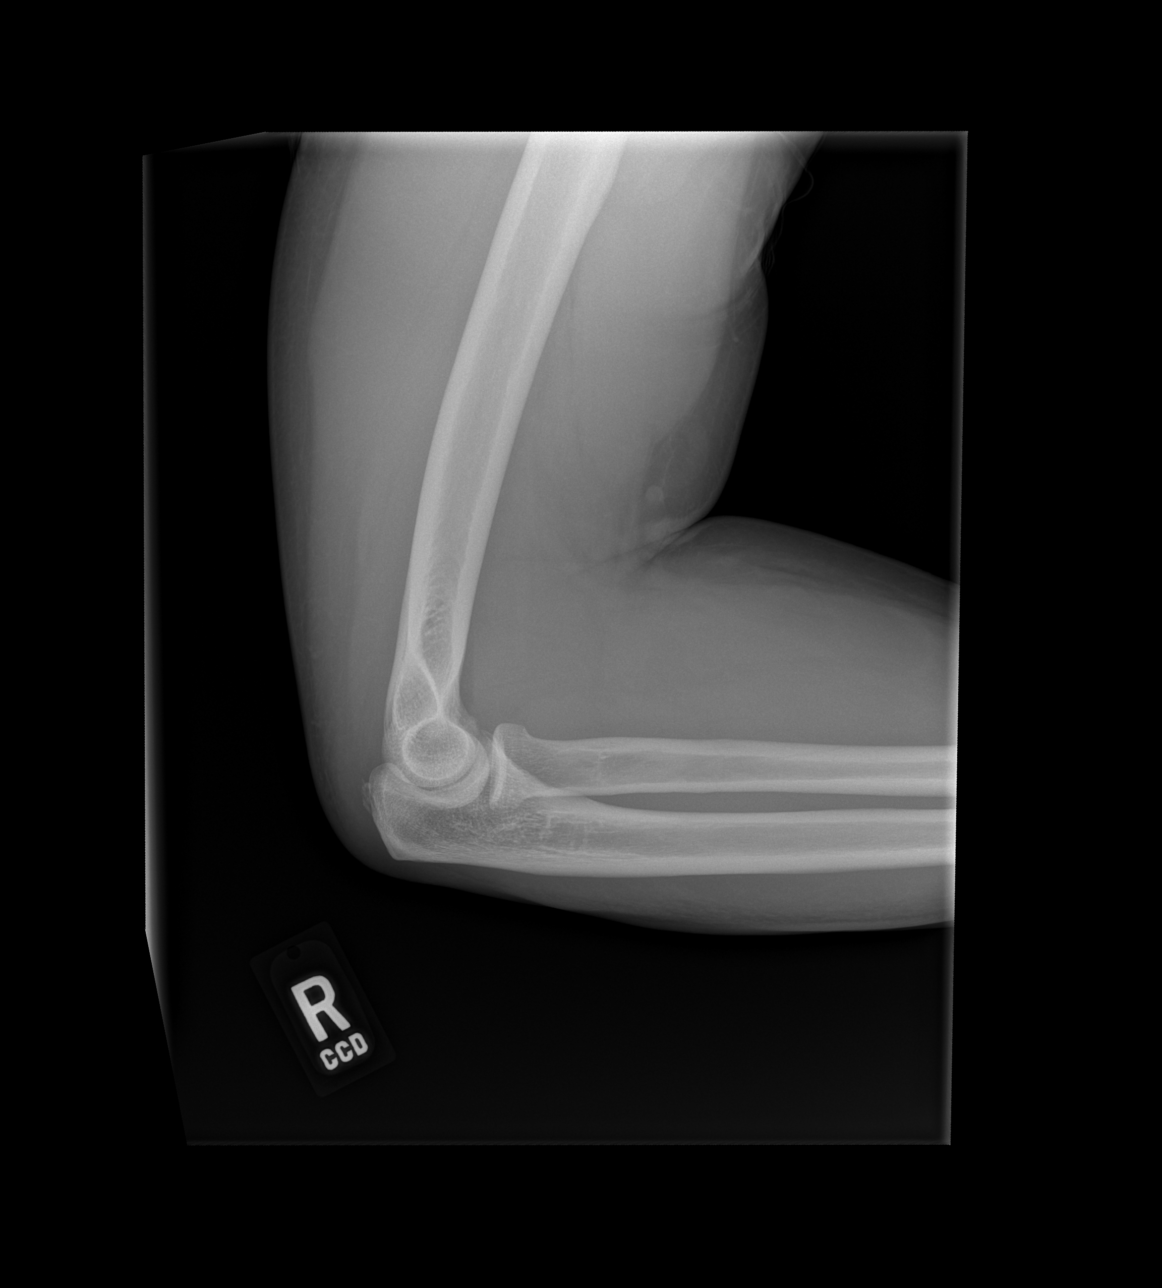

[4 of 4 positions shown; findings below may reference images not displayed]

FINDINGS: Minimal enthesopathic change involving the triceps tendon insertion
site with suspected minimal amount of adjacent soft tissue swelling.
No associated fracture or elbow joint effusion. Joint spaces are
preserved. No radiopaque body.
IMPRESSION: Enthesopathic change involving the triceps tendon insertion site
with suspected adjacent soft tissue swelling, potentially the
sequela of age-indeterminate avulsive injury. Correlation for point
tenderness at this location is advised. Otherwise, no explanation
for patient's diffuse elbow pain.

## 2020-12-09 ENCOUNTER — Other Ambulatory Visit: Payer: Self-pay

## 2020-12-12 ENCOUNTER — Other Ambulatory Visit: Payer: Self-pay

## 2020-12-19 ENCOUNTER — Other Ambulatory Visit: Payer: Self-pay

## 2020-12-22 ENCOUNTER — Other Ambulatory Visit: Payer: Self-pay

## 2020-12-25 ENCOUNTER — Other Ambulatory Visit: Payer: Self-pay | Admitting: Nurse Practitioner

## 2020-12-25 ENCOUNTER — Other Ambulatory Visit: Payer: Self-pay

## 2020-12-25 DIAGNOSIS — E1165 Type 2 diabetes mellitus with hyperglycemia: Secondary | ICD-10-CM

## 2020-12-25 DIAGNOSIS — I10 Essential (primary) hypertension: Secondary | ICD-10-CM

## 2020-12-26 ENCOUNTER — Other Ambulatory Visit: Payer: Self-pay

## 2020-12-26 MED ORDER — AMLODIPINE BESYLATE 10 MG PO TABS
10.0000 mg | ORAL_TABLET | Freq: Every day | ORAL | 2 refills | Status: DC
  Filled 2020-12-26 – 2021-01-02 (×2): qty 30, 30d supply, fill #0
  Filled 2021-02-11: qty 30, 30d supply, fill #1
  Filled 2021-03-25: qty 30, 30d supply, fill #0

## 2020-12-26 MED ORDER — METFORMIN HCL 500 MG PO TABS
1000.0000 mg | ORAL_TABLET | Freq: Two times a day (BID) | ORAL | 2 refills | Status: DC
  Filled 2020-12-26 – 2021-01-02 (×2): qty 120, 30d supply, fill #0
  Filled 2021-02-11: qty 120, 30d supply, fill #1
  Filled 2021-03-25: qty 120, 30d supply, fill #0

## 2020-12-26 NOTE — Telephone Encounter (Signed)
Requested medications are due for refill today too early for Norvasc filled for 90 day supply 11/25/20  Requested medications are on the active medication list yes  Last refill 11/25/20  Last visit 12/02/20  Future visit scheduled no  Notes to clinic Norvasc request too soon, Metformin failed protocol of eGFR greater than 360 days (10/11/19) please assess.  Requested Prescriptions  Pending Prescriptions Disp Refills   amLODipine (NORVASC) 10 MG tablet 90 tablet 0    Sig: Take 1 tablet (10 mg total) by mouth daily.     Cardiovascular:  Calcium Channel Blockers Failed - 12/25/2020  4:14 PM      Failed - Last BP in normal range    BP Readings from Last 1 Encounters:  11/14/20 (!) 142/99          Passed - Valid encounter within last 6 months    Recent Outpatient Visits           3 weeks ago Type 2 diabetes mellitus with hyperglycemia, with long-term current use of insulin (HCC)   [place] [facility] [place], Shea Stakes, NP   1 month ago Type 2 diabetes mellitus with hyperglycemia, without long-term current use of insulin (HCC)   [place] [facility] [place], Cornelius Moras, RPH-CPP   2 months ago Type 2 diabetes mellitus with other specified complication, with long-term current use of insulin (HCC)   [place] [facility] [place], Cornelius Moras, RPH-CPP   3 months ago Type 2 diabetes mellitus with other specified complication, with long-term current use of insulin (HCC)   [place] [facility] [place], [place] W, NP   5 months ago Type 2 diabetes mellitus with hyperglycemia, without long-term current use of insulin (HCC)   [place] [facility] [place], Jeannett Senior L, RPH-CPP               metFORMIN (GLUCOPHAGE) 500 MG tablet 120 tablet 0    Sig: Take 2 tablets (1,000 mg total) by mouth 2 (two) times daily with a meal.     Endocrinology:  Diabetes - Biguanides Failed -  12/25/2020  4:14 PM      Failed - HBA1C is between 0 and 7.9 and within 180 days    HbA1c, POC (controlled diabetic range)  Date Value Ref Range Status  09/01/2020 9.8 (A) 0.0 - 7.0 % Final          Failed - AA eGFR in normal range and within 360 days    GFR calc Af Amer  Date Value Ref Range Status  10/11/2019 119 >59 mL/min/1.73 Final    Comment:    **[facility] currently reports eGFR in compliance with the current**   recommendations of the [employer]. [facility] will   update reporting as new guidelines are published from the NKF-ASN   Task force.    GFR calc non Af Amer  Date Value Ref Range Status  10/11/2019 103 >59 mL/min/1.73 Final   eGFR  Date Value Ref Range Status  10/14/2020 112 >59 mL/min/1.73 Final          Passed - Cr in normal range and within 360 days    Creatinine, Ser  Date Value Ref Range Status  10/14/2020 0.84 0.76 - 1.27 mg/dL Final   Creatinine,U  Date Value Ref Range Status  08/27/2010 136.7 mg/dL Final    Comment:    (NOTE) Cutoff Values for Urine Drug Screen:  Drug Class           Cutoff (ng/mL)        Amphetamines            1000        Barbiturates             200        Cocaine Metabolites      300        Benzodiazepines          200        Methadone                300        Opiates                 2000        Phencyclidine             25        Propoxyphene             300        Marijuana Metabolites     50 For medical purposes only.          Passed - Valid encounter within last 6 months    Recent Outpatient Visits           3 weeks ago Type 2 diabetes mellitus with hyperglycemia, with long-term current use of insulin [facility])   [place], [place] W, NP   1 month ago Type 2 diabetes mellitus with hyperglycemia, without long-term current use of insulin [facility])   [place], Annie Main L, RPH-CPP   2 months ago Type 2 diabetes mellitus  with other specified complication, with long-term current use of insulin [facility])   [place], Annie Main L, RPH-CPP   3 months ago Type 2 diabetes mellitus with other specified complication, with long-term current use of insulin [facility])   [place], [place] W, NP   5 months ago Type 2 diabetes mellitus with hyperglycemia, without long-term current use of insulin [facility])   [place], RPH-CPP

## 2021-01-01 ENCOUNTER — Other Ambulatory Visit: Payer: Self-pay

## 2021-01-02 ENCOUNTER — Other Ambulatory Visit: Payer: Self-pay

## 2021-01-05 ENCOUNTER — Other Ambulatory Visit: Payer: Self-pay

## 2021-01-22 ENCOUNTER — Other Ambulatory Visit: Payer: Self-pay | Admitting: Pharmacist

## 2021-01-22 ENCOUNTER — Other Ambulatory Visit: Payer: Self-pay

## 2021-01-22 MED ORDER — TRULICITY 1.5 MG/0.5ML ~~LOC~~ SOAJ
3.0000 mg | SUBCUTANEOUS | 0 refills | Status: DC
Start: 2021-01-22 — End: 2021-03-25
  Filled 2021-01-22 – 2021-02-11 (×2): qty 4, 28d supply, fill #0

## 2021-01-23 ENCOUNTER — Other Ambulatory Visit: Payer: Self-pay

## 2021-01-26 ENCOUNTER — Other Ambulatory Visit: Payer: Self-pay

## 2021-01-28 ENCOUNTER — Other Ambulatory Visit: Payer: Self-pay

## 2021-01-29 ENCOUNTER — Telehealth: Payer: Self-pay | Admitting: Nurse Practitioner

## 2021-01-29 NOTE — Telephone Encounter (Signed)
Rx was sent for the pens that are in stock 12/8. Pt will need to contact the pharmacy.

## 2021-01-29 NOTE — Telephone Encounter (Signed)
Pt calling in stating that pharmacy is on back order of Dulaglutide (TRULICITY) and he's calling in requesting samples until he cant get it refilled.  [phone]

## 2021-01-30 ENCOUNTER — Other Ambulatory Visit: Payer: Self-pay

## 2021-02-02 ENCOUNTER — Other Ambulatory Visit: Payer: Self-pay

## 2021-02-11 ENCOUNTER — Other Ambulatory Visit: Payer: Self-pay | Admitting: Family Medicine

## 2021-02-11 ENCOUNTER — Other Ambulatory Visit: Payer: Self-pay

## 2021-02-11 ENCOUNTER — Other Ambulatory Visit: Payer: Self-pay | Admitting: Nurse Practitioner

## 2021-02-11 DIAGNOSIS — E785 Hyperlipidemia, unspecified: Secondary | ICD-10-CM

## 2021-02-11 DIAGNOSIS — E1169 Type 2 diabetes mellitus with other specified complication: Secondary | ICD-10-CM

## 2021-02-11 MED ORDER — ATORVASTATIN CALCIUM 40 MG PO TABS
40.0000 mg | ORAL_TABLET | Freq: Every day | ORAL | 0 refills | Status: DC
  Filled 2021-02-11 – 2021-03-25 (×2): qty 30, 30d supply, fill #0
  Filled 2021-05-08: qty 30, 30d supply, fill #1

## 2021-02-11 MED ORDER — HYDROXYZINE HCL 50 MG PO TABS
ORAL_TABLET | ORAL | 0 refills | Status: DC
  Filled 2021-02-11: qty 90, 18d supply, fill #0

## 2021-02-12 ENCOUNTER — Other Ambulatory Visit: Payer: Self-pay

## 2021-03-06 ENCOUNTER — Ambulatory Visit: Payer: Self-pay | Admitting: *Deleted

## 2021-03-06 NOTE — Telephone Encounter (Signed)
°  Chief Complaint: abdominal pain  Symptoms: pain with eating, gas, bloating, diarrhea, blood in vomit Frequency: 1 week Pertinent Negatives: Patient denies   Disposition: [x] ED /[] Urgent Care (no appt availability in office) / [] Appointment(In office/virtual)/ []  [place] [facility]/ [] Home Care/ [] Refused Recommended Disposition /[] [place] Mobile Bus/ []  Follow-up with PCP Additional Notes: Patient states his pain can be severe- extreme bloating, gas, diarrhea, has vomited with blood specks

## 2021-03-06 NOTE — Telephone Encounter (Signed)
Reason for Disposition  [1] Vomiting AND [2] contains red blood or black ("coffee ground") material  (Exception: few red streaks in vomit that only happened once)  Answer Assessment - Initial Assessment Questions 1. LOCATION: "Where does it hurt?"      Stomach area 2. RADIATION: "Does the pain shoot anywhere else?" (e.g., chest, back)     Abdominal distension 3. ONSET: "When did the pain begin?" (Minutes, hours or days ago)      1 week 4. SUDDEN: "Gradual or sudden onset?"     Every time he eats 5. PATTERN "Does the pain come and go, or is it constant?"    - If constant: "Is it getting better, staying the same, or worsening?"      (Note: Constant means the pain never goes away completely; most serious pain is constant and it progresses)     - If intermittent: "How long does it last?" "Do you have pain now?"     (Note: Intermittent means the pain goes away completely between bouts)     Comes and goes with eating 6. SEVERITY: "How bad is the pain?"  (e.g., Scale 1-10; mild, moderate, or severe)    - MILD (1-3): doesn't interfere with normal activities, abdomen soft and not tender to touch     - MODERATE (4-7): interferes with normal activities or awakens from sleep, abdomen tender to touch     - SEVERE (8-10): excruciating pain, doubled over, unable to do any normal activities       moderate 7. RECURRENT SYMPTOM: "Have you ever had this type of stomach pain before?" If Yes, ask: "When was the last time?" and "What happened that time?"      heartburn 8. CAUSE: "What do you think is causing the stomach pain?"     Gas, bloating, indigestion, diarrhea 9. RELIEVING/AGGRAVATING FACTORS: "What makes it better or worse?" (e.g., movement, antacids, bowel movement)     Mylanta, omeprazole did at first- but not know 10. OTHER SYMPTOMS: "Do you have any other symptoms?" (e.g., back pain, diarrhea, fever, urination pain, vomiting)       Diarrhea, vomiting  Protocols used: Abdominal Pain -  Male-A-AH

## 2021-03-25 ENCOUNTER — Other Ambulatory Visit: Payer: Self-pay | Admitting: Nurse Practitioner

## 2021-03-25 ENCOUNTER — Other Ambulatory Visit: Payer: Self-pay | Admitting: Family Medicine

## 2021-03-25 ENCOUNTER — Other Ambulatory Visit: Payer: Self-pay

## 2021-03-25 DIAGNOSIS — E1165 Type 2 diabetes mellitus with hyperglycemia: Secondary | ICD-10-CM

## 2021-03-25 DIAGNOSIS — I10 Essential (primary) hypertension: Secondary | ICD-10-CM

## 2021-03-25 DIAGNOSIS — Z794 Long term (current) use of insulin: Secondary | ICD-10-CM

## 2021-03-25 NOTE — Telephone Encounter (Signed)
Requested medication (s) are due for refill today: yes  Requested medication (s) are on the active medication list: yes  Last refill:  12/02/20 38ml with 1 RF  Future visit scheduled: no, was seen 12/02/20 and asked to return in 2 months, no follow visit made.  Notes to clinic:  asked to return mid Dec, has not and no upcoming visit scheduled. Please assess.    Requested Prescriptions  Pending Prescriptions Disp Refills   insulin glargine (LANTUS) 100 UNIT/ML Solostar Pen 6 mL 1    Sig: Inject 20 Units into the skin daily at 10 pm.     Endocrinology:  Diabetes - Insulins Failed - 03/25/2021  4:26 PM      Failed - HBA1C is between 0 and 7.9 and within 180 days    HbA1c, POC (controlled diabetic range)  Date Value Ref Range Status  09/01/2020 9.8 (A) 0.0 - 7.0 % Final          Passed - Valid encounter within last 6 months    Recent Outpatient Visits           3 months ago Type 2 diabetes mellitus with hyperglycemia, with long-term current use of insulin [facility])   [place] [facility] [place], [place] W, NP   4 months ago Type 2 diabetes mellitus with hyperglycemia, without long-term current use of insulin [facility])   [place] [facility] [place], Jeannett Senior L, RPH-CPP   5 months ago Type 2 diabetes mellitus with other specified complication, with long-term current use of insulin [facility])   [place] [facility] [place], Jeannett Senior L, RPH-CPP   6 months ago Type 2 diabetes mellitus with other specified complication, with long-term current use of insulin [facility])   [place] [facility] [place], [place] W, NP   7 months ago Type 2 diabetes mellitus with hyperglycemia, without long-term current use of insulin [facility])   [facility] [place], Cornelius Moras, RPH-CPP

## 2021-03-25 NOTE — Telephone Encounter (Signed)
Requested medication (s) are due for refill today: yes  Requested medication (s) are on the active medication list: yes  Last refill:  Trulicity [id number] 73ml with 0 RF, Cozaar 11/14/20 #30 and 2 RF  Future visit scheduled: no  Notes to clinic:  Has already had a curtesy refill and there is no upcoming appointment scheduled.  Requested Prescriptions  Pending Prescriptions Disp Refills   Dulaglutide (TRULICITY) 1.5 0000000 SOPN 4 mL 0    Sig: Inject 3 mg into the skin once a week.     Endocrinology:  Diabetes - GLP-1 Receptor Agonists Failed - 03/25/2021  4:27 PM      Failed - HBA1C is between 0 and 7.9 and within 180 days    HbA1c, POC (controlled diabetic range)  Date Value Ref Range Status  09/01/2020 9.8 (A) 0.0 - 7.0 % Final          Passed - Valid encounter within last 6 months    Recent Outpatient Visits           3 months ago Type 2 diabetes mellitus with hyperglycemia, with long-term current use of insulin [facility])   [place], [place] W, NP   4 months ago Type 2 diabetes mellitus with hyperglycemia, without long-term current use of insulin [facility] [place])   [place], Annie Main L, RPH-CPP   5 months ago Type 2 diabetes mellitus with other specified complication, with long-term current use of insulin [facility])   [place], Annie Main L, RPH-CPP   6 months ago Type 2 diabetes mellitus with other specified complication, with long-term current use of insulin [facility])   [place], [place] W, NP   7 months ago Type 2 diabetes mellitus with hyperglycemia, without long-term current use of insulin [facility])   [place], Stephen L, RPH-CPP               losartan (COZAAR) 25 MG tablet 30 tablet 2    Sig: Take 1 tablet (25 mg total) by mouth daily.     Cardiovascular:  Angiotensin Receptor  Blockers Failed - 03/25/2021  4:27 PM      Failed - Last BP in normal range    BP Readings from Last 1 Encounters:  11/14/20 (!) 142/99          Passed - Cr in normal range and within 180 days    Creatinine, Ser  Date Value Ref Range Status  10/14/2020 0.84 0.76 - 1.27 mg/dL Final   Creatinine,U  Date Value Ref Range Status  08/27/2010 136.7 mg/dL Final    Comment:    (NOTE) Cutoff Values for Urine Drug Screen:        Drug Class           Cutoff (ng/mL)        Amphetamines            1000        Barbiturates             200        Cocaine Metabolites      300        Benzodiazepines          200        Methadone                300  Opiates                 2000        Phencyclidine             25        Propoxyphene             300        Marijuana Metabolites     50 For medical purposes only.          Passed - K in normal range and within 180 days    Potassium  Date Value Ref Range Status  10/14/2020 4.4 3.5 - 5.2 mmol/L Final          Passed - Patient is not pregnant      Passed - Valid encounter within last 6 months    Recent Outpatient Visits           3 months ago Type 2 diabetes mellitus with hyperglycemia, with long-term current use of insulin [facility])   [place], [place] W, NP   4 months ago Type 2 diabetes mellitus with hyperglycemia, without long-term current use of insulin [facility])   [place], Annie Main L, RPH-CPP   5 months ago Type 2 diabetes mellitus with other specified complication, with long-term current use of insulin [facility])   [place], Annie Main L, RPH-CPP   6 months ago Type 2 diabetes mellitus with other specified complication, with long-term current use of insulin [facility])   [place], [place] W, NP   7 months ago Type 2 diabetes mellitus with hyperglycemia, without long-term current use  of insulin [facility])   [place], RPH-CPP

## 2021-03-26 ENCOUNTER — Other Ambulatory Visit: Payer: Self-pay

## 2021-03-26 MED ORDER — TRULICITY 1.5 MG/0.5ML ~~LOC~~ SOAJ
3.0000 mg | SUBCUTANEOUS | 0 refills | Status: DC
  Filled 2021-03-26: qty 4, 28d supply, fill #0
  Filled 2021-04-20: qty 2, 28d supply, fill #0
  Filled 2021-04-21: qty 2, 28d supply, fill #1

## 2021-03-26 MED ORDER — LOSARTAN POTASSIUM 25 MG PO TABS
25.0000 mg | ORAL_TABLET | Freq: Every day | ORAL | 0 refills | Status: DC
  Filled 2021-03-26: qty 30, 30d supply, fill #0

## 2021-03-26 MED ORDER — BASAGLAR KWIKPEN 100 UNIT/ML ~~LOC~~ SOPN
20.0000 [IU] | PEN_INJECTOR | Freq: Every day | SUBCUTANEOUS | 0 refills | Status: DC
  Filled 2021-03-26 – 2021-04-20 (×2): qty 6, 30d supply, fill #0

## 2021-03-27 ENCOUNTER — Other Ambulatory Visit: Payer: Self-pay

## 2021-04-02 ENCOUNTER — Other Ambulatory Visit: Payer: Self-pay

## 2021-04-20 ENCOUNTER — Ambulatory Visit: Payer: Self-pay

## 2021-04-20 ENCOUNTER — Other Ambulatory Visit: Payer: Self-pay

## 2021-04-20 NOTE — Telephone Encounter (Signed)
? ? ?  Chief Complaint: Right thigh from hip down to knee ?Symptoms: Above Burning pain ?Frequency: Several weeks ?Pertinent Negatives: Patient denies numbness ?Disposition: [] ED /[] Urgent Care (no appt availability in office) / [] Appointment(In office/virtual)/ []  [place] [facility]/ [] Home Care/ [] Refused Recommended Disposition /[]  Mobile Bus/ [x]  Follow-up with PCP ?Additional Notes: Pt. Asking to be worked in. Please advise.  ?Answer Assessment - Initial Assessment Questions ?1. ONSET: "When did the pain start?"  ?    Started  a few weeks ?2. LOCATION: "Where is the pain located?"  ?    Right leg - hip to knee ?3. PAIN: "How bad is the pain?"    (Scale 1-10; or mild, moderate, severe) ?  -  MILD (1-3): doesn't interfere with normal activities  ?  -  MODERATE (4-7): interferes with normal activities (e.g., work or school) or awakens from sleep, limping  ?  -  SEVERE (8-10): excruciating pain, unable to do any normal activities, unable to walk ?    Sever ?4. WORK OR EXERCISE: "Has there been any recent work or exercise that involved this part of the body?"  ?    No ?5. CAUSE: "What do you think is causing the leg pain?" ?    Unsure ?6. OTHER SYMPTOMS: "Do you have any other symptoms?" (e.g., chest pain, back pain, breathing difficulty, swelling, rash, fever, numbness, weakness) ?    No ?7. PREGNANCY: "Is there any chance you are pregnant?" "When was your last menstrual period?" ?    N/a ? ?Protocols used: Leg Pain-A-AH ? ?

## 2021-04-21 ENCOUNTER — Other Ambulatory Visit: Payer: Self-pay

## 2021-04-28 ENCOUNTER — Other Ambulatory Visit: Payer: Self-pay

## 2021-05-06 ENCOUNTER — Other Ambulatory Visit: Payer: Self-pay

## 2021-05-08 ENCOUNTER — Other Ambulatory Visit: Payer: Self-pay

## 2021-05-08 ENCOUNTER — Other Ambulatory Visit: Payer: Self-pay | Admitting: Family Medicine

## 2021-05-08 DIAGNOSIS — Z794 Long term (current) use of insulin: Secondary | ICD-10-CM

## 2021-05-08 DIAGNOSIS — E1165 Type 2 diabetes mellitus with hyperglycemia: Secondary | ICD-10-CM

## 2021-05-08 DIAGNOSIS — I10 Essential (primary) hypertension: Secondary | ICD-10-CM

## 2021-05-08 MED ORDER — BASAGLAR KWIKPEN 100 UNIT/ML ~~LOC~~ SOPN
20.0000 [IU] | PEN_INJECTOR | Freq: Every day | SUBCUTANEOUS | 0 refills | Status: DC
  Filled 2021-05-08: qty 6, 30d supply, fill #0

## 2021-05-08 MED ORDER — AMLODIPINE BESYLATE 10 MG PO TABS
10.0000 mg | ORAL_TABLET | Freq: Every day | ORAL | 0 refills | Status: DC
  Filled 2021-05-08: qty 30, 30d supply, fill #0

## 2021-05-08 MED ORDER — TRULICITY 1.5 MG/0.5ML ~~LOC~~ SOAJ
3.0000 mg | SUBCUTANEOUS | 0 refills | Status: DC
  Filled 2021-05-08 – 2021-06-25 (×2): qty 4, 28d supply, fill #0

## 2021-05-08 MED ORDER — METFORMIN HCL 500 MG PO TABS
1000.0000 mg | ORAL_TABLET | Freq: Two times a day (BID) | ORAL | 2 refills | Status: DC
  Filled 2021-05-08: qty 120, 30d supply, fill #0
  Filled 2021-06-25: qty 120, 30d supply, fill #1

## 2021-05-11 ENCOUNTER — Other Ambulatory Visit: Payer: Self-pay

## 2021-06-08 ENCOUNTER — Other Ambulatory Visit: Payer: Self-pay | Admitting: Family Medicine

## 2021-06-08 ENCOUNTER — Other Ambulatory Visit: Payer: Self-pay

## 2021-06-08 DIAGNOSIS — I10 Essential (primary) hypertension: Secondary | ICD-10-CM

## 2021-06-08 MED ORDER — AMLODIPINE BESYLATE 10 MG PO TABS
10.0000 mg | ORAL_TABLET | Freq: Every day | ORAL | 1 refills | Status: DC
  Filled 2021-06-08 – 2021-06-25 (×3): qty 30, 30d supply, fill #0

## 2021-06-09 ENCOUNTER — Other Ambulatory Visit: Payer: Self-pay

## 2021-06-15 ENCOUNTER — Other Ambulatory Visit: Payer: Self-pay

## 2021-06-22 ENCOUNTER — Other Ambulatory Visit: Payer: Self-pay

## 2021-06-22 ENCOUNTER — Encounter (HOSPITAL_COMMUNITY): Payer: Self-pay | Admitting: Emergency Medicine

## 2021-06-22 ENCOUNTER — Emergency Department (HOSPITAL_COMMUNITY)
Admission: EM | Admit: 2021-06-22 | Discharge: 2021-06-23 | Disposition: A | Discharge status: Home / Self Care | Payer: Self-pay | Attending: Emergency Medicine | Admitting: Emergency Medicine

## 2021-06-22 DIAGNOSIS — L03317 Cellulitis of buttock: Secondary | ICD-10-CM | POA: Insufficient documentation

## 2021-06-22 DIAGNOSIS — Z79899 Other long term (current) drug therapy: Secondary | ICD-10-CM | POA: Insufficient documentation

## 2021-06-22 DIAGNOSIS — Z7984 Long term (current) use of oral hypoglycemic drugs: Secondary | ICD-10-CM | POA: Insufficient documentation

## 2021-06-22 DIAGNOSIS — E119 Type 2 diabetes mellitus without complications: Secondary | ICD-10-CM | POA: Insufficient documentation

## 2021-06-22 DIAGNOSIS — I10 Essential (primary) hypertension: Secondary | ICD-10-CM | POA: Insufficient documentation

## 2021-06-22 DIAGNOSIS — Z794 Long term (current) use of insulin: Secondary | ICD-10-CM | POA: Insufficient documentation

## 2021-06-22 MED ORDER — SULFAMETHOXAZOLE-TRIMETHOPRIM 800-160 MG PO TABS
1.0000 | ORAL_TABLET | Freq: Two times a day (BID) | ORAL | 0 refills | Status: AC
  Filled 2021-06-22: qty 14, 7d supply, fill #0

## 2021-06-22 MED ORDER — OXYCODONE HCL 5 MG PO TABS
5.0000 mg | ORAL_TABLET | Freq: Four times a day (QID) | ORAL | 0 refills | Status: DC | PRN
  Filled 2021-06-22: qty 10, 3d supply, fill #0

## 2021-06-22 NOTE — ED Provider Notes (Signed)
?[place] ?Provider Note ? ? ?CSN: [id number] ?Arrival date & time: 06/22/21  1309 ? ?  ? ?History ? ?Chief Complaint  ?Patient presents with  ? Abscess  ? ? ?Alan Williams is a 41 y.o. male with a history of hypertension and diabetes. ? ?Patient presents with pain and swelling over the left buttock for the past 3 days.  Patient states that initially started as a small bump about 3 days ago.  He tried soaking in a tub but did not notice any draining from the lesion.  However, over the past 3 days he has noted that the lesion has become much larger and more painful.  He was having difficulty sitting, walking, and standing due to the pain.  He has not tried any other treatments at home.  Does have a history of a prior abscess in the axilla but states that this was years ago.  Denies any associated fevers, abdominal pain, nausea, vomiting. ? ?The history is provided by the patient.  ?Abscess ?Associated symptoms: no fever, no nausea and no vomiting   ? ?  ? ?Home Medications ?Prior to Admission medications   ?Medication Sig Start Date End Date Taking? Authorizing Provider  ?oxyCODONE (ROXICODONE) 5 MG immediate release tablet Take 1 tablet (5 mg total) by mouth every 6 (six) hours as needed for up to 10 doses for breakthrough pain. 06/22/21  Yes Curatolo, Adam, DO  ?sulfamethoxazole-trimethoprim (BACTRIM DS) 800-160 MG tablet Take 1 tablet by mouth 2 (two) times daily for 7 days. 06/22/21 06/29/21 Yes Sondra Come, MD  ?amLODipine (NORVASC) 10 MG tablet Take 1 tablet (10 mg total) by mouth daily. 06/08/21 09/06/21  Charlott Rakes, MD  ?atorvastatin (LIPITOR) 40 MG tablet Take 1 tablet (40 mg total) by mouth daily. After evening meal to lower cholesterol 02/11/21 06/10/21  Gildardo Pounds, NP  ?Blood Glucose Monitoring Suppl (TRUE METRIX METER) w/Device KIT Check blood sugar TID. 10/14/20   Charlott Rakes, MD  ?Dulaglutide (TRULICITY) 1.5 KZ/9.9JT SOPN Inject 3 mg into the skin once a  week. 05/08/21   Charlott Rakes, MD  ?fluticasone (FLONASE) 50 MCG/ACT nasal spray Place 1 spray into both nostrils daily. 09/01/20   Gildardo Pounds, NP  ?gabapentin (NEURONTIN) 300 MG capsule Take 1 capsule (300 mg total) by mouth at bedtime. 06/02/20   [name], [name], PA-C  ?glucose blood (TRUE METRIX BLOOD GLUCOSE TEST) test strip Use as instructed to check BS three times per day 01/29/20   [name], [name], PA-C  ?hydrOXYzine (ATARAX) 50 MG tablet TAKE 1 TABLET (50 MG TOTAL) BY MOUTH EVERY 8 (EIGHT) HOURS AS NEEDED FOR ANXIETY. AND 1-2 TABLETS BY MOUTH AT BEDTIME 02/11/21   Gildardo Pounds, NP  ?ibuprofen (ADVIL) 800 MG tablet Take 1 tablet (800 mg total) by mouth every 8 (eight) hours as needed for moderate pain. ?Patient not taking: Reported on 09/01/2020 05/30/20   Faustino Congress, NP  ?Insulin Glargine (BASAGLAR KWIKPEN) 100 UNIT/ML Inject 20 Units into the skin daily at 10 pm. 05/08/21 07/07/21  Charlott Rakes, MD  ?Insulin Pen Needle (TRUEPLUS 5-BEVEL PEN NEEDLES) 31G X 5 MM MISC use as directed to inject insulin into skin once  nightly 06/02/20   [name], [name], PA-C  ?losartan (COZAAR) 25 MG tablet Take 1 tablet (25 mg total) by mouth daily. 03/26/21   Charlott Rakes, MD  ?metFORMIN (GLUCOPHAGE) 500 MG tablet Take 2 tablets (1,000 mg total) by mouth 2 (two) times daily with a  meal. 05/08/21   Charlott Rakes, MD  ?omeprazole (PRILOSEC) 20 MG capsule Take 1 capsule (20 mg total) by mouth 2 (two) times daily before a meal. 06/02/20   [name], [name], PA-C  ?ondansetron (ZOFRAN ODT) 4 MG disintegrating tablet Take 1 tablet (4 mg total) by mouth every 8 (eight) hours as needed. 06/13/19   Fredia Sorrow, MD  ?sildenafil (VIAGRA) 100 MG tablet Take 0.5-1 tablets (50-100 mg total) by mouth daily as needed for erectile dysfunction. 09/01/20   Gildardo Pounds, NP  ?TRUEplus Lancets 28G MISC Use for 3 times daily testing of blood sugars 11/05/19   Fulp, Cammie, MD  ?Vitamin D, Ergocalciferol, (DRISDOL) 1.25 MG (50000  UNIT) CAPS capsule Take 1 capsule (50,000 Units total) by mouth every 7 (seven) days. ?Patient not taking: Reported on 09/01/2020 06/03/20   [name], Loraine Grip, PA-C  ?   ? ?Allergies    ?Bee venom, Shellfish allergy, and Contrast media [iodinated contrast media]   ? ?Review of Systems   ?Review of Systems  ?Constitutional:  Negative for fever.  ?Respiratory:  Negative for cough and shortness of breath.   ?Gastrointestinal:  Negative for abdominal pain, diarrhea, nausea and vomiting.  ?Skin:   ?     Positive for lesion on the left buttock.  ?Neurological:  Negative for light-headedness.  ? ?Physical Exam ?Updated Vital Signs ?BP (!) 134/107   Pulse (!) 107   Temp 97.8 ?F (36.6 ?C) (Oral)   Resp 16   Ht 5' 7" (1.702 m)   Wt 106.6 kg   SpO2 96%   BMI 36.81 kg/m?  ?Physical Exam ?Vitals and nursing note reviewed.  ?Constitutional:   ?   General: He is not in acute distress. ?   Appearance: He is well-developed. He is obese. He is not ill-appearing, toxic-appearing or diaphoretic.  ?HENT:  ?   Head: Normocephalic and atraumatic.  ?   Nose: Nose normal.  ?Eyes:  ?   General: No scleral icterus. ?Cardiovascular:  ?   Rate and Rhythm: Normal rate and regular rhythm.  ?   Heart sounds: Normal heart sounds. No murmur heard. ?  No friction rub. No gallop.  ?Pulmonary:  ?   Effort: Pulmonary effort is normal. No respiratory distress.  ?   Breath sounds: Normal breath sounds. No stridor. No wheezing, rhonchi or rales.  ?Abdominal:  ?   Palpations: Abdomen is soft.  ?   Tenderness: There is no abdominal tenderness. There is no guarding or rebound.  ?Musculoskeletal:     ?   General: No swelling.  ?   Cervical back: Neck supple. No rigidity.  ?Skin: ?   General: Skin is warm and dry.  ?   Capillary Refill: Capillary refill takes less than 2 seconds.  ?   Comments: There is a 3 x 1 cm area of induration over the superior aspect of the left gluteal cleft without fluctuance or overlying erythema.  No active draining.   ?Neurological:  ?   General: No focal deficit present.  ?   Mental Status: He is alert and oriented to person, place, and time.  ?Psychiatric:     ?   Mood and Affect: Mood normal.  ? ? ?ED Results / Procedures / Treatments   ?Labs ?(all labs ordered are listed, but only abnormal results are displayed) ?Labs Reviewed - No data to display ? ?EKG ?None ? ?Radiology ?No results found. ? ?Procedures ?Procedures  ? ?Medications Ordered in ED ?Medications - No  data to display ? ?ED Course/ Medical Decision Making/ A&P ?  ?                        ?Medical Decision Making ?Risk ?Prescription drug management. ? ? ?41 year old male with a history of hypertension and diabetes presenting to the ED with painful lesion of the left buttock. ? ?On exam, the patient is afebrile and mildly tachycardic to the low 100s.  He does note that he is very anxious being here.  He was also noted to be hypertensive while in triage and states that he has been out of his hypertensive medications for the past 2 to 3 days.  He does plan on picking up refills of his medications after leaving the ED.  On the left gluteal cleft, there is a 1 x 3 cm area of induration without fluctuance or drainage. ? ?Bedside ultrasound was obtained which did show cobblestoning of the stone but without any abscess formation or area amenable to drainage.  Most consistent with cellulitis or early abscess. ? ?Patient is afebrile and does not have any systemic symptoms to no concern for severe infection or sepsis at this time.  I do not think that labs are required given he is well-appearing and does not have any systemic symptoms.  No indication for admission at this time. ? ?Recommended treatment with Bactrim x7 days.  Patient also requesting pain medication given that he works on his feet and has been in his significant other pain.  A few days ago narcotic medication was sent to his pharmacy.  I recommended follow-up with his PCP in 1 week for a wound recheck.  Strict  return precautions were discussed and the patient was discharged home in stable condition. ? ?Final Clinical Impression(s) / ED Diagnoses ?Final diagnoses:  ?Cellulitis of buttock  ? ? ?Rx / DC Orders ?ED Discharge Order

## 2021-06-22 NOTE — ED Provider Triage Note (Signed)
Emergency Medicine Provider Triage Evaluation Note ? ?Alan Williams , a 41 y.o. male  was evaluated in triage.  Pt complains of abscess to left buttocks.  Patient reports that he noticed a small blackhead a few days prior however this improved after performing Epsom salt soak.  Over the last 3 to 4 days patient has noticed worsening swelling and tenderness to buttocks.   ? ?Denies any fever, chills, purulent discharge. ? ?Review of Systems  ?Positive: See above ?Negative: See above ? ?Physical Exam  ?BP (!) 155/124   Pulse (!) 104   Temp 97.8 ?F (36.6 ?C) (Oral)   Ht 5\' 7"  (1.702 m)   Wt 106.6 kg   SpO2 97%   BMI 36.81 kg/m?  ?Gen:   Awake, no distress   ?Resp:  Normal effort  ?MSK:   Moves extremities without difficulty  ?Other:  Tenderness and induration noted to left buttocks extending to intergluteal cleft. ? ?Medical Decision Making  ?Medically screening exam initiated at 1:40 PM.  Appropriate orders placed.  Alan Williams was informed that the remainder of the evaluation will be completed by another provider, this initial triage assessment does not replace that evaluation, and the importance of remaining in the ED until their evaluation is complete. ? ?Suspect abscess which will require incision and drainage. ?  ?Loni Beckwith, PA-C ?06/22/21 1342 ? ?

## 2021-06-22 NOTE — ED Triage Notes (Signed)
Pt noticed large knot to right side of buttocks. Pt tried soaking in the bathtub but noticed it worsened.  Pt ran out of HTN meds but to pick them up today.  ?

## 2021-06-22 NOTE — Discharge Instructions (Addendum)
Start Bactrim twice a day for 7 days. ?Start Roxicodone, 5mg  every 6 hours as needed for severe pain. You can also alternate Tylenol and Motrin every 3-4 hours as needed for pain. ?

## 2021-06-23 ENCOUNTER — Other Ambulatory Visit: Payer: Self-pay

## 2021-06-25 ENCOUNTER — Other Ambulatory Visit: Payer: Self-pay | Admitting: Family Medicine

## 2021-06-25 ENCOUNTER — Other Ambulatory Visit: Payer: Self-pay

## 2021-06-25 DIAGNOSIS — E1165 Type 2 diabetes mellitus with hyperglycemia: Secondary | ICD-10-CM

## 2021-06-26 NOTE — Telephone Encounter (Signed)
Requested medication (s) are due for refill today:   Yes ? ?Requested medication (s) are on the active medication list:   Yes ? ?Future visit scheduled:   Yes 07/20/2021 with [name] ? ? ?Last ordered: 05/08/2021 6 ml, 0 refills     OV needed for refills per note ? ?Returned for provider to review for refill prior to appt listed above.  ? ?Requested Prescriptions  ?Pending Prescriptions Disp Refills  ? Insulin Glargine (BASAGLAR KWIKPEN) 100 UNIT/ML 6 mL 0  ?  Sig: Inject 20 Units into the skin daily at 10 pm.  ?  ? Endocrinology:  Diabetes - Insulins Failed - 06/25/2021  3:19 PM  ?  ?  Failed - HBA1C is between 0 and 7.9 and within 180 days  ?  HbA1c, POC (controlled diabetic range)  ?Date Value Ref Range Status  ?09/01/2020 9.8 (A) 0.0 - 7.0 % Final  ?   ?  ?  Failed - Valid encounter within last 6 months  ?  Recent Outpatient Visits   ? ?      ? 6 months ago Type 2 diabetes mellitus with hyperglycemia, with long-term current use of insulin (HCC)  ? [facility] [place], [place] W, NP  ? 7 months ago Type 2 diabetes mellitus with hyperglycemia, without long-term current use of insulin (HCC)  ? [facility] [place], Cornelius Moras, RPH-CPP  ? 8 months ago Type 2 diabetes mellitus with other specified complication, with long-term current use of insulin (HCC)  ? [facility] Lois Huxley, Cornelius Moras, RPH-CPP  ? 9 months ago Type 2 diabetes mellitus with other specified complication, with long-term current use of insulin (HCC)  ? [facility] [place], [place] W, NP  ? 11 months ago Type 2 diabetes mellitus with hyperglycemia, without long-term current use of insulin (HCC)  ? [facility] Lois Huxley, Cornelius Moras, RPH-CPP  ? ?  ?  ?Future Appointments   ? ?        ? In 3 weeks Claiborne Rigg, NP [facility]  ? ?  ? ? ?  ?  ?  ? ?

## 2021-07-01 ENCOUNTER — Other Ambulatory Visit: Payer: Self-pay

## 2021-07-01 MED ORDER — BASAGLAR KWIKPEN 100 UNIT/ML ~~LOC~~ SOPN
20.0000 [IU] | PEN_INJECTOR | Freq: Every day | SUBCUTANEOUS | 0 refills | Status: DC
  Filled 2021-07-01: qty 6, 30d supply, fill #0

## 2021-07-08 ENCOUNTER — Other Ambulatory Visit: Payer: Self-pay

## 2021-07-20 ENCOUNTER — Other Ambulatory Visit: Payer: Self-pay

## 2021-07-20 ENCOUNTER — Ambulatory Visit: Payer: Self-pay | Attending: Nurse Practitioner | Admitting: Nurse Practitioner

## 2021-07-20 ENCOUNTER — Encounter: Payer: Self-pay | Admitting: Nurse Practitioner

## 2021-07-20 VITALS — BP 131/90 | HR 100 | Temp 97.6°F | Resp 16 | Wt 235.0 lb

## 2021-07-20 DIAGNOSIS — E1169 Type 2 diabetes mellitus with other specified complication: Secondary | ICD-10-CM

## 2021-07-20 DIAGNOSIS — J302 Other seasonal allergic rhinitis: Secondary | ICD-10-CM

## 2021-07-20 DIAGNOSIS — I10 Essential (primary) hypertension: Secondary | ICD-10-CM

## 2021-07-20 DIAGNOSIS — Z794 Long term (current) use of insulin: Secondary | ICD-10-CM

## 2021-07-20 DIAGNOSIS — D72829 Elevated white blood cell count, unspecified: Secondary | ICD-10-CM

## 2021-07-20 DIAGNOSIS — K219 Gastro-esophageal reflux disease without esophagitis: Secondary | ICD-10-CM

## 2021-07-20 DIAGNOSIS — N521 Erectile dysfunction due to diseases classified elsewhere: Secondary | ICD-10-CM

## 2021-07-20 DIAGNOSIS — F5101 Primary insomnia: Secondary | ICD-10-CM

## 2021-07-20 DIAGNOSIS — E785 Hyperlipidemia, unspecified: Secondary | ICD-10-CM

## 2021-07-20 DIAGNOSIS — E1165 Type 2 diabetes mellitus with hyperglycemia: Secondary | ICD-10-CM

## 2021-07-20 DIAGNOSIS — F411 Generalized anxiety disorder: Secondary | ICD-10-CM

## 2021-07-20 LAB — POCT GLYCOSYLATED HEMOGLOBIN (HGB A1C): Hemoglobin A1C: 10.2 % — AB (ref 4.0–5.6)

## 2021-07-20 MED ORDER — TRUEPLUS 5-BEVEL PEN NEEDLES 31G X 5 MM MISC
3 refills | Status: DC
  Filled 2021-07-20 – 2021-12-15 (×2): qty 100, 100d supply, fill #0
  Filled 2021-12-25: qty 100, 25d supply, fill #0
  Filled 2022-03-16: qty 100, 25d supply, fill #1

## 2021-07-20 MED ORDER — LOSARTAN POTASSIUM 50 MG PO TABS
50.0000 mg | ORAL_TABLET | Freq: Every day | ORAL | 1 refills | Status: DC
  Filled 2021-07-20 – 2021-08-11 (×2): qty 30, 30d supply, fill #0

## 2021-07-20 MED ORDER — GABAPENTIN 300 MG PO CAPS
300.0000 mg | ORAL_CAPSULE | Freq: Every day | ORAL | 0 refills | Status: DC
  Filled 2021-07-20: qty 90, 90d supply, fill #0

## 2021-07-20 MED ORDER — FLUTICASONE PROPIONATE 50 MCG/ACT NA SUSP
1.0000 | Freq: Every day | NASAL | 0 refills | Status: DC
  Filled 2021-07-20: qty 16, 60d supply, fill #0
  Filled 2021-08-11: qty 16, 30d supply, fill #0

## 2021-07-20 MED ORDER — TRULICITY 1.5 MG/0.5ML ~~LOC~~ SOAJ
3.0000 mg | SUBCUTANEOUS | 3 refills | Status: DC
  Filled 2021-07-20 – 2021-08-11 (×2): qty 4, 28d supply, fill #0

## 2021-07-20 MED ORDER — SILDENAFIL CITRATE 100 MG PO TABS
50.0000 mg | ORAL_TABLET | Freq: Every day | ORAL | 11 refills | Status: DC | PRN
  Filled 2021-07-20: qty 5, 15d supply, fill #0

## 2021-07-20 MED ORDER — TRUE METRIX BLOOD GLUCOSE TEST VI STRP
ORAL_STRIP | 6 refills | Status: DC
  Filled 2021-07-20: qty 100, 34d supply, fill #0
  Filled 2021-08-11: qty 100, 33d supply, fill #0

## 2021-07-20 MED ORDER — TRUEPLUS LANCETS 28G MISC
6 refills | Status: DC
  Filled 2021-07-20: qty 100, 34d supply, fill #0
  Filled 2021-08-11: qty 100, 33d supply, fill #0
  Filled 2021-12-15 – 2021-12-25 (×2): qty 100, 33d supply, fill #1

## 2021-07-20 MED ORDER — ATORVASTATIN CALCIUM 40 MG PO TABS
40.0000 mg | ORAL_TABLET | Freq: Every day | ORAL | 0 refills | Status: DC
  Filled 2021-07-20 – 2021-11-25 (×2): qty 30, 30d supply, fill #0
  Filled 2021-12-25: qty 30, 30d supply, fill #1
  Filled 2022-03-16 – 2022-04-09 (×2): qty 30, 30d supply, fill #2

## 2021-07-20 MED ORDER — AMLODIPINE BESYLATE 10 MG PO TABS
10.0000 mg | ORAL_TABLET | Freq: Every day | ORAL | 1 refills | Status: DC
  Filled 2021-07-20 – 2021-08-11 (×2): qty 30, 30d supply, fill #0
  Filled 2021-09-11: qty 30, 30d supply, fill #1

## 2021-07-20 MED ORDER — BUSPIRONE HCL 15 MG PO TABS
15.0000 mg | ORAL_TABLET | Freq: Two times a day (BID) | ORAL | 3 refills | Status: DC
Start: 2021-07-20 — End: 2022-03-16
  Filled 2021-07-20 – 2021-08-11 (×2): qty 60, 30d supply, fill #0
  Filled 2021-09-11 – 2021-10-16 (×2): qty 60, 30d supply, fill #1
  Filled 2021-12-15 – 2021-12-25 (×2): qty 60, 30d supply, fill #2
  Filled 2022-02-05: qty 60, 30d supply, fill #3

## 2021-07-20 MED ORDER — TRAZODONE HCL 100 MG PO TABS
100.0000 mg | ORAL_TABLET | Freq: Every day | ORAL | 1 refills | Status: DC
  Filled 2021-07-20: qty 30, 30d supply, fill #0
  Filled 2021-09-11: qty 90, 90d supply, fill #0

## 2021-07-20 MED ORDER — OMEPRAZOLE 20 MG PO CPDR
20.0000 mg | DELAYED_RELEASE_CAPSULE | Freq: Two times a day (BID) | ORAL | 3 refills | Status: DC
  Filled 2021-07-20: qty 60, 30d supply, fill #0
  Filled 2021-08-11: qty 180, 90d supply, fill #0
  Filled 2021-12-15 – 2021-12-25 (×2): qty 180, 90d supply, fill #1
  Filled 2022-02-05: qty 180, 90d supply, fill #2
  Filled 2022-04-09: qty 60, 30d supply, fill #2

## 2021-07-20 MED ORDER — BASAGLAR KWIKPEN 100 UNIT/ML ~~LOC~~ SOPN
20.0000 [IU] | PEN_INJECTOR | Freq: Every day | SUBCUTANEOUS | 3 refills | Status: DC
  Filled 2021-07-20 – 2021-08-11 (×2): qty 6, 30d supply, fill #0

## 2021-07-20 MED ORDER — TRUE METRIX METER W/DEVICE KIT
PACK | 0 refills | Status: DC
  Filled 2021-07-20: qty 1, 30d supply, fill #0
  Filled 2021-08-11: qty 1, 20d supply, fill #0

## 2021-07-20 MED ORDER — METFORMIN HCL 500 MG PO TABS
1000.0000 mg | ORAL_TABLET | Freq: Two times a day (BID) | ORAL | 6 refills | Status: DC
  Filled 2021-07-20 – 2021-08-11 (×2): qty 120, 30d supply, fill #0
  Filled 2021-09-11: qty 120, 30d supply, fill #1
  Filled 2021-10-16: qty 120, 30d supply, fill #2
  Filled 2021-11-25: qty 120, 30d supply, fill #3
  Filled 2021-12-15 – 2021-12-25 (×2): qty 120, 30d supply, fill #4
  Filled 2022-02-05 (×2): qty 120, 30d supply, fill #5
  Filled 2022-03-15: qty 120, 30d supply, fill #6

## 2021-07-20 NOTE — Progress Notes (Signed)
Bilateral thumb pain. Unable to grip like he used to. Works as a Investment banker, operational.   Knee pain   Medication refil/ A1C  A1C- 11.6
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
## 2021-07-21 ENCOUNTER — Other Ambulatory Visit: Payer: Self-pay

## 2021-07-21 LAB — LIPID PANEL
Chol/HDL Ratio: 4 ratio (ref 0.0–5.0)
Cholesterol, Total: 161 mg/dL (ref 100–199)
HDL: 40 mg/dL (ref >39)
LDL Chol Calc (NIH): 90 mg/dL (ref 0–99)
Triglycerides: 183 mg/dL — ABNORMAL HIGH (ref 0–149)
VLDL Cholesterol Cal: 31 mg/dL (ref 5–40)

## 2021-07-21 LAB — CMP14+EGFR
ALT: 24 IU/L (ref 0–44)
AST: 17 IU/L (ref 0–40)
Albumin/Globulin Ratio: 1.7 (ref 1.2–2.2)
Albumin: 4.5 g/dL (ref 4.0–5.0)
Alkaline Phosphatase: 97 IU/L (ref 44–121)
BUN/Creatinine Ratio: 13 (ref 9–20)
BUN: 13 mg/dL (ref 6–24)
Bilirubin Total: 0.4 mg/dL (ref 0.0–1.2)
CO2: 25 mmol/L (ref 20–29)
Calcium: 9.5 mg/dL (ref 8.7–10.2)
Chloride: 100 mmol/L (ref 96–106)
Creatinine, Ser: 0.99 mg/dL (ref 0.76–1.27)
Globulin, Total: 2.6 g/dL (ref 1.5–4.5)
Glucose: 301 mg/dL — ABNORMAL HIGH (ref 70–99)
Potassium: 4.3 mmol/L (ref 3.5–5.2)
Sodium: 138 mmol/L (ref 134–144)
Total Protein: 7.1 g/dL (ref 6.0–8.5)
eGFR: 98 mL/min/{1.73_m2} (ref >59)

## 2021-07-21 LAB — CBC WITH DIFFERENTIAL/PLATELET
Basophils Absolute: 0 10*3/uL (ref 0.0–0.2)
Basos: 0 %
EOS (ABSOLUTE): 0.1 10*3/uL (ref 0.0–0.4)
Eos: 2 %
Hematocrit: 48.4 % (ref 37.5–51.0)
Hemoglobin: 15.7 g/dL (ref 13.0–17.7)
Immature Grans (Abs): 0 10*3/uL (ref 0.0–0.1)
Immature Granulocytes: 0 %
Lymphocytes Absolute: 1.5 10*3/uL (ref 0.7–3.1)
Lymphs: 31 %
MCH: 25.7 pg — ABNORMAL LOW (ref 26.6–33.0)
MCHC: 32.4 g/dL (ref 31.5–35.7)
MCV: 79 fL (ref 79–97)
Monocytes Absolute: 0.6 10*3/uL (ref 0.1–0.9)
Monocytes: 13 %
Neutrophils Absolute: 2.5 10*3/uL (ref 1.4–7.0)
Neutrophils: 54 %
Platelets: 261 10*3/uL (ref 150–450)
RBC: 6.1 x10E6/uL — ABNORMAL HIGH (ref 4.14–5.80)
RDW: 12.8 % (ref 11.6–15.4)
WBC: 4.8 10*3/uL (ref 3.4–10.8)

## 2021-07-27 ENCOUNTER — Other Ambulatory Visit: Payer: Self-pay

## 2021-07-30 ENCOUNTER — Other Ambulatory Visit: Payer: Self-pay

## 2021-07-31 ENCOUNTER — Other Ambulatory Visit: Payer: Self-pay

## 2021-08-04 ENCOUNTER — Ambulatory Visit: Payer: Self-pay | Attending: Nurse Practitioner | Admitting: Nurse Practitioner

## 2021-08-04 ENCOUNTER — Telehealth: Payer: Self-pay | Admitting: Nurse Practitioner

## 2021-08-04 DIAGNOSIS — F419 Anxiety disorder, unspecified: Secondary | ICD-10-CM

## 2021-08-04 NOTE — Progress Notes (Signed)
Mailbox full. Unable to leave VM. [phone]

## 2021-08-04 NOTE — Telephone Encounter (Signed)
[phone]  Mailbox full unable to LVM  [phone]  No answer. Unable to LVM

## 2021-08-07 ENCOUNTER — Other Ambulatory Visit: Payer: Self-pay

## 2021-08-11 ENCOUNTER — Other Ambulatory Visit: Payer: Self-pay

## 2021-08-27 ENCOUNTER — Ambulatory Visit: Payer: Self-pay | Attending: Nurse Practitioner | Admitting: Pharmacist

## 2021-08-27 ENCOUNTER — Encounter: Payer: Self-pay | Admitting: Pharmacist

## 2021-08-27 ENCOUNTER — Other Ambulatory Visit: Payer: Self-pay

## 2021-08-27 VITALS — BP 138/94

## 2021-08-27 DIAGNOSIS — Z794 Long term (current) use of insulin: Secondary | ICD-10-CM

## 2021-08-27 DIAGNOSIS — E1165 Type 2 diabetes mellitus with hyperglycemia: Secondary | ICD-10-CM

## 2021-08-27 MED ORDER — LOSARTAN POTASSIUM 100 MG PO TABS
100.0000 mg | ORAL_TABLET | Freq: Every day | ORAL | 1 refills | Status: DC
Start: 2021-08-27 — End: 2022-03-16
  Filled 2021-08-27: qty 30, 30d supply, fill #0
  Filled 2021-08-31 – 2021-09-11 (×2): qty 90, 90d supply, fill #0
  Filled 2021-12-15 – 2021-12-25 (×2): qty 90, 90d supply, fill #1

## 2021-08-27 MED ORDER — SEMAGLUTIDE (1 MG/DOSE) 4 MG/3ML ~~LOC~~ SOPN
1.0000 mg | PEN_INJECTOR | SUBCUTANEOUS | 1 refills | Status: DC
  Filled 2021-08-27 – 2021-12-15 (×7): qty 3, 28d supply, fill #0

## 2021-08-27 NOTE — Progress Notes (Signed)
    S:    PCP: Alan Williams PMH: HTN, T2DM, HLD, GERD, diverticulitis   Patient arrives in good spirits. Presents for diabetes management at the request of PCP. Patient was seen and referred by her on 07/20/2021. He admitted to going a prolonged period of time without medications prior to that visit. A1c showed continued poor control of DM.  Reports today that he is doing well since his last visit with Alan Williams. Endorses being without medication ~2 months prior to last PCP visit. Since then, he has resumed metformin, Trulicity, and Lantus at rx'd doses. He is doing better with medication adherence overall. Denies NV, abdominal pain. No changes in vision since resuming Trulicity.    Family/Social History:  - FH:  Mother and Maternal grandmother: HTN  -Tobacco: former smoker  - Alcohol: none reported   Insurance coverage/medication affordability: reports that his new insurance starts next week.   Current diabetes medications include: Basaglar 20 units daily, metformin 1000 mg BID (takes 2 of the 500 mg tabs BID), Trulicity 3 mg weekly (takes 2 of the 1.5 mg injections at once) Current HTN medications include: amlodipine 10 mg daily, losartan 50 mg daily  Current HLD medications: atorvastatin 40 mg daily  Patient denies hypoglycemic events.   Endorses the following regarding his home CBGs:  Reported morning CBGs: not checking in the morning Reported post-prandial CBGs: 136 - 188. Nothing >200.  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  -Plans to start working out in the gym at his apartment complex.   Patient denies polyuria, polydipsia, and polyphagia.  Patient endorses neuropathy. Patient denies any changes in vision.   O:   Lab Results  Component Value Date   HGBA1C 10.2 (A) 07/20/2021   Vitals:   08/27/21 1407  BP: (!)  138/94    Lipid Panel     Component Value Date/Time   CHOL 161 07/20/2021 1626   TRIG 183 (H) 07/20/2021 1626   HDL 40 07/20/2021 1626   CHOLHDL 4.0 07/20/2021 1626   CHOLHDL 3.6 08/27/2010 0521   VLDL 50 (H) 08/27/2010 0521   LDLCALC 90 07/20/2021 1626   Clinical ASCVD: No   A/P: Diabetes longstanding currently uncontrolled based on A1c of 10.2. Pt is adherent to his current medication regimen. Expresses interest in changing to Ozempic. He has made efforts to improve lifestyle. Commended him on this! Reinforced appropriate glycemic goals. He has a new insurance plan that will become active next week. Will send Ozempic downstairs for [place] to try and get coverage next week once his insurance is active next week.  -Continue Trulicity 3mg  weekly for now. Recommend to switch over to Ozempic once insurance is active next week. I have sent a rx for this to go on file downstairs.  -Continue metformin and Lantus at current doses.  -Requests referrals to hand specialist -Requests Ortho referral. Message sent to PCP for these.  -Next anticipated A1c is 10/2021.   Written patient instructions provided.  Total time in face to face counseling 30 minutes.  Follow up in clinic with [facility] in 4 weeks.      Alan Williams, PharmD, Alan Williams, CPP Clinical Pharmacist [facility] [phone]

## 2021-08-31 ENCOUNTER — Other Ambulatory Visit: Payer: Self-pay

## 2021-08-31 ENCOUNTER — Other Ambulatory Visit: Payer: Self-pay | Admitting: Pharmacist

## 2021-08-31 ENCOUNTER — Other Ambulatory Visit: Payer: Self-pay | Admitting: Nurse Practitioner

## 2021-08-31 DIAGNOSIS — N529 Male erectile dysfunction, unspecified: Secondary | ICD-10-CM

## 2021-08-31 MED ORDER — TADALAFIL 20 MG PO TABS
10.0000 mg | ORAL_TABLET | ORAL | 11 refills | Status: DC | PRN
  Filled 2021-08-31: qty 4, 30d supply, fill #0
  Filled 2021-09-11: qty 10, 20d supply, fill #0
  Filled 2021-11-25: qty 10, 20d supply, fill #1
  Filled 2021-12-25: qty 10, 20d supply, fill #2
  Filled 2022-02-05: qty 10, 20d supply, fill #3
  Filled 2022-04-09: qty 10, 20d supply, fill #4
  Filled 2022-06-24 – 2022-07-05 (×2): qty 10, 20d supply, fill #5

## 2021-09-01 ENCOUNTER — Other Ambulatory Visit: Payer: Self-pay

## 2021-09-01 ENCOUNTER — Other Ambulatory Visit: Payer: Self-pay | Admitting: Pharmacist

## 2021-09-01 ENCOUNTER — Encounter: Payer: Self-pay | Admitting: Nurse Practitioner

## 2021-09-01 ENCOUNTER — Telehealth (HOSPITAL_BASED_OUTPATIENT_CLINIC_OR_DEPARTMENT_OTHER): Payer: BLUE CROSS/BLUE SHIELD | Admitting: Nurse Practitioner

## 2021-09-01 DIAGNOSIS — E1165 Type 2 diabetes mellitus with hyperglycemia: Secondary | ICD-10-CM

## 2021-09-01 DIAGNOSIS — G5601 Carpal tunnel syndrome, right upper limb: Secondary | ICD-10-CM

## 2021-09-01 MED ORDER — BASAGLAR KWIKPEN 100 UNIT/ML ~~LOC~~ SOPN
26.0000 [IU] | PEN_INJECTOR | Freq: Every day | SUBCUTANEOUS | 1 refills | Status: DC
  Filled 2021-09-01: qty 15, 30d supply, fill #0
  Filled 2021-09-11 – 2021-10-16 (×2): qty 15, 57d supply, fill #0
  Filled 2021-12-15: qty 9, 34d supply, fill #1
  Filled 2021-12-25: qty 15, 57d supply, fill #1
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
## 2021-09-07 ENCOUNTER — Other Ambulatory Visit: Payer: Self-pay

## 2021-09-08 ENCOUNTER — Other Ambulatory Visit: Payer: Self-pay

## 2021-09-11 ENCOUNTER — Other Ambulatory Visit: Payer: Self-pay

## 2021-09-11 ENCOUNTER — Ambulatory Visit: Payer: Self-pay | Admitting: Orthopedic Surgery

## 2021-09-14 ENCOUNTER — Other Ambulatory Visit: Payer: Self-pay

## 2021-09-15 ENCOUNTER — Encounter: Payer: Self-pay | Admitting: Nurse Practitioner

## 2021-09-17 ENCOUNTER — Other Ambulatory Visit: Payer: Self-pay

## 2021-10-05 ENCOUNTER — Ambulatory Visit: Payer: Self-pay | Admitting: Pharmacist

## 2021-10-16 ENCOUNTER — Other Ambulatory Visit: Payer: Self-pay

## 2021-10-16 ENCOUNTER — Other Ambulatory Visit: Payer: Self-pay | Admitting: Nurse Practitioner

## 2021-10-16 DIAGNOSIS — I10 Essential (primary) hypertension: Secondary | ICD-10-CM

## 2021-10-16 MED ORDER — AMLODIPINE BESYLATE 10 MG PO TABS
10.0000 mg | ORAL_TABLET | Freq: Every day | ORAL | 0 refills | Status: DC
  Filled 2021-10-16: qty 30, 30d supply, fill #0

## 2021-10-20 ENCOUNTER — Other Ambulatory Visit: Payer: Self-pay

## 2021-10-21 ENCOUNTER — Other Ambulatory Visit: Payer: Self-pay

## 2021-10-21 ENCOUNTER — Ambulatory Visit: Payer: Self-pay | Admitting: Nurse Practitioner

## 2021-11-25 ENCOUNTER — Other Ambulatory Visit: Payer: Self-pay

## 2021-11-25 ENCOUNTER — Other Ambulatory Visit: Payer: Self-pay | Admitting: Family Medicine

## 2021-11-25 DIAGNOSIS — I10 Essential (primary) hypertension: Secondary | ICD-10-CM

## 2021-11-25 MED ORDER — AMLODIPINE BESYLATE 10 MG PO TABS
10.0000 mg | ORAL_TABLET | Freq: Every day | ORAL | 0 refills | Status: DC
  Filled 2021-11-25: qty 30, 30d supply, fill #0

## 2021-11-27 ENCOUNTER — Other Ambulatory Visit: Payer: Self-pay

## 2021-12-15 ENCOUNTER — Other Ambulatory Visit: Payer: Self-pay

## 2021-12-22 ENCOUNTER — Other Ambulatory Visit: Payer: Self-pay

## 2021-12-25 ENCOUNTER — Other Ambulatory Visit: Payer: Self-pay

## 2021-12-25 ENCOUNTER — Other Ambulatory Visit: Payer: Self-pay | Admitting: Family Medicine

## 2021-12-25 DIAGNOSIS — I10 Essential (primary) hypertension: Secondary | ICD-10-CM

## 2021-12-28 ENCOUNTER — Other Ambulatory Visit: Payer: Self-pay

## 2022-02-05 ENCOUNTER — Other Ambulatory Visit: Payer: Self-pay

## 2022-02-05 ENCOUNTER — Other Ambulatory Visit: Payer: Self-pay | Admitting: Family Medicine

## 2022-02-05 DIAGNOSIS — I10 Essential (primary) hypertension: Secondary | ICD-10-CM

## 2022-02-05 NOTE — Telephone Encounter (Signed)
Requested Prescriptions  Refused Prescriptions Disp Refills   amLODipine (NORVASC) 10 MG tablet 30 tablet 0    Sig: Take 1 tablet (10 mg total) by mouth daily.     Cardiovascular: Calcium Channel Blockers 2 Failed - 02/05/2022  3:24 PM      Failed - Last BP in normal range    BP Readings from Last 1 Encounters:  08/27/21 (!) 138/94         Passed - Last Heart Rate in normal range    Pulse Readings from Last 1 Encounters:  07/20/21 100         Passed - Valid encounter within last 6 months    Recent Outpatient Visits           5 months ago Carpal tunnel syndrome of right wrist   [facility] [place], [place] W, NP   5 months ago Type 2 diabetes mellitus with hyperglycemia, with long-term current use of insulin [facility])   [facility] [place], Cornelius Moras, RPH-CPP   6 months ago Primary hypertension   [place] [facility] [place], [place] W, NP   1 year ago Type 2 diabetes mellitus with hyperglycemia, with long-term current use of insulin [facility])   [place] [facility] [place], [place] W, NP   1 year ago Type 2 diabetes mellitus with hyperglycemia, without long-term current use of insulin [facility])   [facility] [place], Cornelius Moras, RPH-CPP

## 2022-03-15 ENCOUNTER — Other Ambulatory Visit: Payer: Self-pay

## 2022-03-16 ENCOUNTER — Other Ambulatory Visit: Payer: Self-pay

## 2022-03-16 ENCOUNTER — Other Ambulatory Visit: Payer: Self-pay | Admitting: Nurse Practitioner

## 2022-03-16 ENCOUNTER — Other Ambulatory Visit: Payer: Self-pay | Admitting: Family Medicine

## 2022-03-16 DIAGNOSIS — F411 Generalized anxiety disorder: Secondary | ICD-10-CM

## 2022-03-16 DIAGNOSIS — I10 Essential (primary) hypertension: Secondary | ICD-10-CM

## 2022-03-16 DIAGNOSIS — E1165 Type 2 diabetes mellitus with hyperglycemia: Secondary | ICD-10-CM

## 2022-03-16 MED ORDER — BUSPIRONE HCL 15 MG PO TABS
15.0000 mg | ORAL_TABLET | Freq: Two times a day (BID) | ORAL | 3 refills | Status: DC
  Filled 2022-03-16: qty 60, 30d supply, fill #0

## 2022-03-16 NOTE — Telephone Encounter (Signed)
Requested medication (s) are due for refill today: yes  Requested medication (s) are on the active medication list: yes  Last refill:  11/25/21  Future visit scheduled: no  Notes to clinic:  Unable to refill per protocol, courtesy refill already given, routing for provider approval.      Requested Prescriptions  Pending Prescriptions Disp Refills   losartan (COZAAR) 100 MG tablet 90 tablet 1    Sig: Take 1 tablet (100 mg total) by mouth daily.     Cardiovascular:  Angiotensin Receptor Blockers Failed - 03/16/2022  9:33 AM      Failed - Cr in normal range and within 180 days    Creatinine, Ser  Date Value Ref Range Status  07/20/2021 0.99 0.76 - 1.27 mg/dL Final   Creatinine,U  Date Value Ref Range Status  08/27/2010 136.7 mg/dL Final    Comment:    (NOTE) Cutoff Values for Urine Drug Screen:        Drug Class           Cutoff (ng/mL)        Amphetamines            1000        Barbiturates             200        Cocaine Metabolites      300        Benzodiazepines          200        Methadone                300        Opiates                 2000        Phencyclidine             25        Propoxyphene             300        Marijuana Metabolites     50 For medical purposes only.         Failed - K in normal range and within 180 days    Potassium  Date Value Ref Range Status  07/20/2021 4.3 3.5 - 5.2 mmol/L Final         Failed - Last BP in normal range    BP Readings from Last 1 Encounters:  08/27/21 (!) 138/94         Failed - Valid encounter within last 6 months    Recent Outpatient Visits           6 months ago Carpal tunnel syndrome of right wrist   [place], [place] W, NP   6 months ago Type 2 diabetes mellitus with hyperglycemia, with long-term current use of insulin [facility])   [place], Jarome Matin, RPH-CPP   7 months ago Primary hypertension   [place], [place] W, NP   1 year ago Type 2 diabetes mellitus with hyperglycemia, with long-term current use of insulin [facility])   [place], [place] W, NP   1 year ago Type 2 diabetes mellitus with hyperglycemia, without long-term current use of insulin [facility])   [place], RPH-CPP  Passed - Patient is not pregnant       Insulin Glargine (BASAGLAR KWIKPEN) 100 UNIT/ML 15 mL 1    Sig: Inject 26 Units into the skin daily at 10 pm.     Endocrinology:  Diabetes - Insulins Failed - 03/16/2022  9:33 AM      Failed - HBA1C is between 0 and 7.9 and within 180 days    Hemoglobin A1C  Date Value Ref Range Status  07/20/2021 10.2 (A) 4.0 - 5.6 % Final   HbA1c, POC (controlled diabetic range)  Date Value Ref Range Status  09/01/2020 9.8 (A) 0.0 - 7.0 % Final         Failed - Valid encounter within last 6 months    Recent Outpatient Visits           6 months ago Carpal tunnel syndrome of right wrist   [place], [place] W, NP   6 months ago Type 2 diabetes mellitus with hyperglycemia, with long-term current use of insulin [facility])   [place], Jarome Matin, RPH-CPP   7 months ago Primary hypertension   [place], [place] W, NP   1 year ago Type 2 diabetes mellitus with hyperglycemia, with long-term current use of insulin [facility])   [place], [place] W, NP   1 year ago Type 2 diabetes mellitus with hyperglycemia, without long-term current use of insulin [facility])   [place] Ausdall, Stephen L, RPH-CPP               amLODipine (NORVASC) 10 MG tablet 30 tablet 0    Sig: Take 1 tablet (10 mg total) by mouth daily.     Cardiovascular:  Calcium Channel Blockers 2 Failed - 03/16/2022  9:33 AM      Failed - Last BP in normal range    BP Readings from Last 1 Encounters:  08/27/21 (!) 138/94         Failed - Valid encounter within last 6 months    Recent Outpatient Visits           6 months ago Carpal tunnel syndrome of right wrist   [place], [place] W, NP   6 months ago Type 2 diabetes mellitus with hyperglycemia, with long-term current use of insulin [facility])   [place], Jarome Matin, RPH-CPP   7 months ago Primary hypertension   [place], [place] W, NP   1 year ago Type 2 diabetes mellitus with hyperglycemia, with long-term current use of insulin [facility])   [place], [place] W, NP   1 year ago Type 2 diabetes mellitus with hyperglycemia, without long-term current use of insulin [facility])   [place] Ausdall, [place] L, RPH-CPP              Passed - Last Heart Rate in normal range    Pulse Readings from Last 1 Encounters:  07/20/21 100

## 2022-03-16 NOTE — Telephone Encounter (Signed)
Requested medication (s) are due for refill today: yes  Requested medication (s) are on the active medication list: yes  Last refill:  07/20/21 #120 6 RF  Future visit scheduled: no  Notes to clinic:  overdue lab work   Requested Prescriptions  Pending Prescriptions Disp Refills   metFORMIN (GLUCOPHAGE) 500 MG tablet 120 tablet     Sig: Take 2 tablets (1,000 mg total) by mouth 2 (two) times daily with a meal.     Endocrinology:  Diabetes - Biguanides Failed - 03/16/2022  9:33 AM      Failed - HBA1C is between 0 and 7.9 and within 180 days    Hemoglobin A1C  Date Value Ref Range Status  07/20/2021 10.2 (A) 4.0 - 5.6 % Final   HbA1c, POC (controlled diabetic range)  Date Value Ref Range Status  09/01/2020 9.8 (A) 0.0 - 7.0 % Final         Failed - B12 Level in normal range and within 720 days    No results found for: "VITAMINB12"       Failed - Valid encounter within last 6 months    Recent Outpatient Visits           6 months ago Carpal tunnel syndrome of right wrist   [place], [place] W, NP   6 months ago Type 2 diabetes mellitus with hyperglycemia, with long-term current use of insulin [facility])   [place], Jarome Matin, RPH-CPP   7 months ago Primary hypertension   [place], [place] W, NP   1 year ago Type 2 diabetes mellitus with hyperglycemia, with long-term current use of insulin [facility])   [place], [place] W, NP   1 year ago Type 2 diabetes mellitus with hyperglycemia, without long-term current use of insulin [facility]   [place] Ausdall, [place] L, RPH-CPP              Passed - Cr in normal range and within 360 days    Creatinine, Ser  Date Value Ref Range Status  07/20/2021 0.99 0.76 - 1.27 mg/dL Final   Creatinine,U  Date Value Ref Range Status   08/27/2010 136.7 mg/dL Final    Comment:    (NOTE) Cutoff Values for Urine Drug Screen:        Drug Class           Cutoff (ng/mL)        Amphetamines            1000        Barbiturates             200        Cocaine Metabolites      300        Benzodiazepines          200        Methadone                300        Opiates                 2000        Phencyclidine             25        Propoxyphene  300        Marijuana Metabolites     50 For medical purposes only.         Passed - eGFR in normal range and within 360 days    GFR calc Af Amer  Date Value Ref Range Status  10/11/2019 119 >59 mL/min/1.73 Final    Comment:    **[facility] currently reports eGFR in compliance with the current**   recommendations of the [employer]. [facility] will   update reporting as new guidelines are published from the NKF-ASN   Task force.    GFR calc non Af Amer  Date Value Ref Range Status  10/11/2019 103 >59 mL/min/1.73 Final   eGFR  Date Value Ref Range Status  07/20/2021 98 >59 mL/min/1.73 Final         Passed - CBC within normal limits and completed in the last 12 months    WBC  Date Value Ref Range Status  07/20/2021 4.8 3.4 - 10.8 x10E3/uL Final  06/13/2019 6.5 4.0 - 10.5 K/uL Final   RBC  Date Value Ref Range Status  07/20/2021 6.10 (H) 4.14 - 5.80 x10E6/uL Final  06/13/2019 5.66 4.22 - 5.81 MIL/uL Final   Hemoglobin  Date Value Ref Range Status  07/20/2021 15.7 13.0 - 17.7 g/dL Final   Hematocrit  Date Value Ref Range Status  07/20/2021 48.4 37.5 - 51.0 % Final   MCHC  Date Value Ref Range Status  07/20/2021 32.4 31.5 - 35.7 g/dL Final  06/13/2019 33.0 30.0 - 36.0 g/dL Final   [facility]  Date Value Ref Range Status  07/20/2021 25.7 (L) 26.6 - 33.0 pg Final  06/13/2019 26.3 26.0 - 34.0 pg Final   MCV  Date Value Ref Range Status  07/20/2021 79 79 - 97 fL Final   No results found for: "PLTCOUNTKUC", "LABPLAT", "POCPLA" RDW  Date Value  Ref Range Status  07/20/2021 12.8 11.6 - 15.4 % Final         Signed Prescriptions Disp Refills   busPIRone (BUSPAR) 15 MG tablet 60 tablet 3    Sig: Take 1 tablet (15 mg total) by mouth 2 (two) times daily.     Psychiatry: Anxiolytics/Hypnotics - Non-controlled Passed - 03/16/2022  9:33 AM      Passed - Valid encounter within last 12 months    Recent Outpatient Visits           6 months ago Carpal tunnel syndrome of right wrist   [place], [place] W, NP   6 months ago Type 2 diabetes mellitus with hyperglycemia, with long-term current use of insulin [facility])   [place], Jarome Matin, RPH-CPP   7 months ago Primary hypertension   [place], [place] W, NP   1 year ago Type 2 diabetes mellitus with hyperglycemia, with long-term current use of insulin [facility])   [place], [place] W, NP   1 year ago Type 2 diabetes mellitus with hyperglycemia, without long-term current use of insulin [facility])   [place], RPH-CPP

## 2022-03-16 NOTE — Telephone Encounter (Signed)
Requested Prescriptions  Pending Prescriptions Disp Refills   busPIRone (BUSPAR) 15 MG tablet 60 tablet 3    Sig: Take 1 tablet (15 mg total) by mouth 2 (two) times daily.     Psychiatry: Anxiolytics/Hypnotics - Non-controlled Passed - 03/16/2022  9:33 AM      Passed - Valid encounter within last 12 months    Recent Outpatient Visits           6 months ago Carpal tunnel syndrome of right wrist   [place], Vernia Buff, NP   6 months ago Type 2 diabetes mellitus with hyperglycemia, with long-term current use of insulin [facility])   [place], Jarome Matin, RPH-CPP   7 months ago Primary hypertension   [place], Vernia Buff, NP   1 year ago Type 2 diabetes mellitus with hyperglycemia, with long-term current use of insulin ([place])   [place], Vernia Buff, NP   1 year ago Type 2 diabetes mellitus with hyperglycemia, without long-term current use of insulin [facility])   [place], Annie [place] L, RPH-CPP               metFORMIN (GLUCOPHAGE) 500 MG tablet 120 tablet     Sig: Take 2 tablets (1,000 mg total) by mouth 2 (two) times daily with a meal.     Endocrinology:  Diabetes - Biguanides Failed - 03/16/2022  9:33 AM      Failed - HBA1C is between 0 and 7.9 and within 180 days    Hemoglobin A1C  Date Value Ref Range Status  07/20/2021 10.2 (A) 4.0 - 5.6 % Final   HbA1c, POC (controlled diabetic range)  Date Value Ref Range Status  09/01/2020 9.8 (A) 0.0 - 7.0 % Final         Failed - B12 Level in normal range and within 720 days    No results found for: "VITAMINB12"       Failed - Valid encounter within last 6 months    Recent Outpatient Visits           6 months ago Carpal tunnel syndrome of right wrist   [place], [place] W, NP    6 months ago Type 2 diabetes mellitus with hyperglycemia, with long-term current use of insulin [facility])   [place], Jarome Matin, RPH-CPP   7 months ago Primary hypertension   [place], [place] W, NP   1 year ago Type 2 diabetes mellitus with hyperglycemia, with long-term current use of insulin [facility])   [place], [place] W, NP   1 year ago Type 2 diabetes mellitus with hyperglycemia, without long-term current use of insulin [facility])   [place] Ausdall, [place] L, RPH-CPP              Passed - Cr in normal range and within 360 days    Creatinine, Ser  Date Value Ref Range Status  07/20/2021 0.99 0.76 - 1.27 mg/dL Final   Creatinine,U  Date Value Ref Range Status  08/27/2010 136.7 mg/dL Final    Comment:    (NOTE) Cutoff Values for Urine Drug Screen:  Drug Class           Cutoff (ng/mL)        Amphetamines            1000        Barbiturates             200        Cocaine Metabolites      300        Benzodiazepines          200        Methadone                300        Opiates                 2000        Phencyclidine             25        Propoxyphene             300        Marijuana Metabolites     50 For medical purposes only.         Passed - eGFR in normal range and within 360 days    GFR calc Af Amer  Date Value Ref Range Status  10/11/2019 119 >59 mL/min/1.73 Final    Comment:    **[facility] currently reports eGFR in compliance with the current**   recommendations of the [employer]. [facility] will   update reporting as new guidelines are published from the NKF-ASN   Task force.    GFR calc non Af Amer  Date Value Ref Range Status  10/11/2019 103 >59 mL/min/1.73 Final   eGFR  Date Value Ref Range Status  07/20/2021 98 >59 mL/min/1.73 Final         Passed - CBC  within normal limits and completed in the last 12 months    WBC  Date Value Ref Range Status  07/20/2021 4.8 3.4 - 10.8 x10E3/uL Final  06/13/2019 6.5 4.0 - 10.5 K/uL Final   RBC  Date Value Ref Range Status  07/20/2021 6.10 (H) 4.14 - 5.80 x10E6/uL Final  06/13/2019 5.66 4.22 - 5.81 MIL/uL Final   Hemoglobin  Date Value Ref Range Status  07/20/2021 15.7 13.0 - 17.7 g/dL Final   Hematocrit  Date Value Ref Range Status  07/20/2021 48.4 37.5 - 51.0 % Final   MCHC  Date Value Ref Range Status  07/20/2021 32.4 31.5 - 35.7 g/dL Final  06/13/2019 33.0 30.0 - 36.0 g/dL Final   [facility]  Date Value Ref Range Status  07/20/2021 25.7 (L) 26.6 - 33.0 pg Final  06/13/2019 26.3 26.0 - 34.0 pg Final   MCV  Date Value Ref Range Status  07/20/2021 79 79 - 97 fL Final   No results found for: "PLTCOUNTKUC", "LABPLAT", "POCPLA" RDW  Date Value Ref Range Status  07/20/2021 12.8 11.6 - 15.4 % Final

## 2022-03-17 ENCOUNTER — Other Ambulatory Visit: Payer: Self-pay

## 2022-03-17 MED ORDER — AMLODIPINE BESYLATE 10 MG PO TABS
10.0000 mg | ORAL_TABLET | Freq: Every day | ORAL | 0 refills | Status: DC
  Filled 2022-03-17: qty 30, 30d supply, fill #0

## 2022-03-17 MED ORDER — BASAGLAR KWIKPEN 100 UNIT/ML ~~LOC~~ SOPN
26.0000 [IU] | PEN_INJECTOR | Freq: Every day | SUBCUTANEOUS | 0 refills | Status: DC
  Filled 2022-03-17: qty 9, 34d supply, fill #0
  Filled 2022-04-09: qty 15, 57d supply, fill #0

## 2022-03-17 MED ORDER — METFORMIN HCL 500 MG PO TABS
1000.0000 mg | ORAL_TABLET | Freq: Two times a day (BID) | ORAL | 0 refills | Status: DC
  Filled 2022-03-17 – 2022-04-09 (×2): qty 120, 30d supply, fill #0

## 2022-03-17 MED ORDER — LOSARTAN POTASSIUM 100 MG PO TABS
100.0000 mg | ORAL_TABLET | Freq: Every day | ORAL | 0 refills | Status: DC
  Filled 2022-03-17: qty 30, 30d supply, fill #0

## 2022-03-23 ENCOUNTER — Other Ambulatory Visit: Payer: Self-pay

## 2022-03-24 ENCOUNTER — Other Ambulatory Visit: Payer: Self-pay

## 2022-04-06 ENCOUNTER — Other Ambulatory Visit: Payer: Self-pay

## 2022-04-09 ENCOUNTER — Other Ambulatory Visit: Payer: Self-pay

## 2022-05-05 ENCOUNTER — Ambulatory Visit: Payer: Self-pay | Attending: Nurse Practitioner | Admitting: Nurse Practitioner

## 2022-05-05 ENCOUNTER — Encounter: Payer: Self-pay | Admitting: Nurse Practitioner

## 2022-05-05 ENCOUNTER — Other Ambulatory Visit: Payer: Self-pay

## 2022-05-05 VITALS — BP 133/92 | HR 98 | Ht 67.0 in | Wt 243.8 lb

## 2022-05-05 DIAGNOSIS — F419 Anxiety disorder, unspecified: Secondary | ICD-10-CM

## 2022-05-05 DIAGNOSIS — K219 Gastro-esophageal reflux disease without esophagitis: Secondary | ICD-10-CM

## 2022-05-05 DIAGNOSIS — E1165 Type 2 diabetes mellitus with hyperglycemia: Secondary | ICD-10-CM

## 2022-05-05 DIAGNOSIS — Z794 Long term (current) use of insulin: Secondary | ICD-10-CM

## 2022-05-05 DIAGNOSIS — F32A Depression, unspecified: Secondary | ICD-10-CM

## 2022-05-05 DIAGNOSIS — E1169 Type 2 diabetes mellitus with other specified complication: Secondary | ICD-10-CM

## 2022-05-05 DIAGNOSIS — D72829 Elevated white blood cell count, unspecified: Secondary | ICD-10-CM

## 2022-05-05 DIAGNOSIS — F5101 Primary insomnia: Secondary | ICD-10-CM

## 2022-05-05 DIAGNOSIS — J302 Other seasonal allergic rhinitis: Secondary | ICD-10-CM

## 2022-05-05 DIAGNOSIS — I10 Essential (primary) hypertension: Secondary | ICD-10-CM

## 2022-05-05 DIAGNOSIS — E785 Hyperlipidemia, unspecified: Secondary | ICD-10-CM

## 2022-05-05 MED ORDER — TRUEPLUS 5-BEVEL PEN NEEDLES 31G X 5 MM MISC
3 refills | Status: DC
  Filled 2022-05-05: qty 100, 25d supply, fill #0
  Filled 2022-05-22: qty 100, 90d supply, fill #0
  Filled 2022-06-11: qty 100, 25d supply, fill #0
  Filled 2022-09-06 – 2022-09-13 (×2): qty 100, 25d supply, fill #1
  Filled 2023-01-14: qty 100, 25d supply, fill #2
  Filled 2023-04-17: qty 100, 25d supply, fill #3

## 2022-05-05 MED ORDER — METFORMIN HCL 500 MG PO TABS
1000.0000 mg | ORAL_TABLET | Freq: Two times a day (BID) | ORAL | 1 refills | Status: DC
  Filled 2022-05-05: qty 120, 30d supply, fill #0
  Filled 2022-06-23: qty 120, 30d supply, fill #1
  Filled 2022-07-27: qty 120, 30d supply, fill #2

## 2022-05-05 MED ORDER — BUSPIRONE HCL 15 MG PO TABS
15.0000 mg | ORAL_TABLET | Freq: Two times a day (BID) | ORAL | 3 refills | Status: DC
  Filled 2022-05-05: qty 180, 90d supply, fill #0
  Filled 2023-02-10: qty 60, 30d supply, fill #1

## 2022-05-05 MED ORDER — LOSARTAN POTASSIUM 100 MG PO TABS
100.0000 mg | ORAL_TABLET | Freq: Every day | ORAL | 1 refills | Status: DC
  Filled 2022-05-05: qty 90, 90d supply, fill #0

## 2022-05-05 MED ORDER — AMLODIPINE BESYLATE 10 MG PO TABS
10.0000 mg | ORAL_TABLET | Freq: Every day | ORAL | 1 refills | Status: DC
  Filled 2022-05-05: qty 90, 90d supply, fill #0
  Filled 2022-08-29 – 2022-09-13 (×2): qty 90, 90d supply, fill #1

## 2022-05-05 MED ORDER — BASAGLAR KWIKPEN 100 UNIT/ML ~~LOC~~ SOPN
26.0000 [IU] | PEN_INJECTOR | Freq: Every day | SUBCUTANEOUS | 0 refills | Status: DC
  Filled 2022-05-05: qty 15, 57d supply, fill #0
  Filled 2022-06-23: qty 6, 23d supply, fill #0

## 2022-05-05 MED ORDER — TRUE METRIX BLOOD GLUCOSE TEST VI STRP
ORAL_STRIP | 6 refills | Status: AC
  Filled 2022-05-05 – 2022-05-22 (×2): qty 100, 33d supply, fill #0
  Filled 2023-03-07: qty 100, 30d supply, fill #0
  Filled 2023-03-07: qty 100, 33d supply, fill #0

## 2022-05-05 MED ORDER — OMEPRAZOLE 20 MG PO CPDR
20.0000 mg | DELAYED_RELEASE_CAPSULE | Freq: Two times a day (BID) | ORAL | 3 refills | Status: DC
  Filled 2022-05-05: qty 180, 90d supply, fill #0
  Filled 2022-09-06 – 2022-09-13 (×2): qty 180, 90d supply, fill #1
  Filled 2022-12-20 – 2023-01-14 (×2): qty 180, 90d supply, fill #2
  Filled 2023-04-17 – 2023-04-22 (×2): qty 180, 90d supply, fill #3

## 2022-05-05 MED ORDER — TRAZODONE HCL 100 MG PO TABS
100.0000 mg | ORAL_TABLET | Freq: Every day | ORAL | 1 refills | Status: AC
  Filled 2022-05-05: qty 90, 90d supply, fill #0

## 2022-05-05 MED ORDER — ESCITALOPRAM OXALATE 10 MG PO TABS
10.0000 mg | ORAL_TABLET | Freq: Every day | ORAL | 1 refills | Status: DC
  Filled 2022-05-05: qty 90, 90d supply, fill #0

## 2022-05-05 MED ORDER — SEMAGLUTIDE (1 MG/DOSE) 4 MG/3ML ~~LOC~~ SOPN
1.0000 mg | PEN_INJECTOR | SUBCUTANEOUS | 1 refills | Status: DC
  Filled 2022-05-05: qty 3, 28d supply, fill #0
  Filled 2022-06-11: qty 3, 28d supply, fill #1

## 2022-05-05 MED ORDER — ACCU-CHEK SOFTCLIX LANCETS MISC
6 refills | Status: DC
  Filled 2022-05-05 – 2023-03-07 (×3): qty 100, 33d supply, fill #0

## 2022-05-05 MED ORDER — ATORVASTATIN CALCIUM 40 MG PO TABS
40.0000 mg | ORAL_TABLET | Freq: Every day | ORAL | 1 refills | Status: DC
  Filled 2022-05-05: qty 90, 90d supply, fill #0
  Filled 2022-09-06 – 2022-09-13 (×2): qty 90, 90d supply, fill #1

## 2022-05-05 MED ORDER — FLUTICASONE PROPIONATE 50 MCG/ACT NA SUSP
1.0000 | Freq: Every day | NASAL | 1 refills | Status: DC
  Filled 2022-05-05: qty 16, 30d supply, fill #0
  Filled 2022-06-23: qty 16, 30d supply, fill #1

## 2022-05-05 MED ORDER — GABAPENTIN 300 MG PO CAPS
300.0000 mg | ORAL_CAPSULE | Freq: Every day | ORAL | 0 refills | Status: DC
  Filled 2022-05-05: qty 90, 90d supply, fill #0

## 2022-05-05 NOTE — Progress Notes (Signed)
Patient stated that 2 days ago he felt chest pain.  Chest pain on and off for a couple of months.
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
## 2022-05-06 LAB — CMP14+EGFR
ALT: 22 IU/L (ref 0–44)
AST: 17 IU/L (ref 0–40)
Albumin/Globulin Ratio: 1.7 (ref 1.2–2.2)
Albumin: 4.5 g/dL (ref 4.1–5.1)
Alkaline Phosphatase: 82 IU/L (ref 44–121)
BUN/Creatinine Ratio: 17 (ref 9–20)
BUN: 15 mg/dL (ref 6–24)
Bilirubin Total: 0.3 mg/dL (ref 0.0–1.2)
CO2: 26 mmol/L (ref 20–29)
Calcium: 10.1 mg/dL (ref 8.7–10.2)
Chloride: 98 mmol/L (ref 96–106)
Creatinine, Ser: 0.87 mg/dL (ref 0.76–1.27)
Globulin, Total: 2.6 g/dL (ref 1.5–4.5)
Glucose: 322 mg/dL — ABNORMAL HIGH (ref 70–99)
Potassium: 4.4 mmol/L (ref 3.5–5.2)
Sodium: 138 mmol/L (ref 134–144)
Total Protein: 7.1 g/dL (ref 6.0–8.5)
eGFR: 110 mL/min/{1.73_m2} (ref >59)

## 2022-05-06 LAB — CBC WITH DIFFERENTIAL/PLATELET
Basophils Absolute: 0 10*3/uL (ref 0.0–0.2)
Basos: 1 %
EOS (ABSOLUTE): 0.1 10*3/uL (ref 0.0–0.4)
Eos: 2 %
Hematocrit: 44.7 % (ref 37.5–51.0)
Hemoglobin: 14 g/dL (ref 13.0–17.7)
Immature Grans (Abs): 0 10*3/uL (ref 0.0–0.1)
Immature Granulocytes: 1 %
Lymphocytes Absolute: 1.5 10*3/uL (ref 0.7–3.1)
Lymphs: 37 %
MCH: 25.5 pg — ABNORMAL LOW (ref 26.6–33.0)
MCHC: 31.3 g/dL — ABNORMAL LOW (ref 31.5–35.7)
MCV: 81 fL (ref 79–97)
Monocytes Absolute: 0.5 10*3/uL (ref 0.1–0.9)
Monocytes: 12 %
Neutrophils Absolute: 2 10*3/uL (ref 1.4–7.0)
Neutrophils: 47 %
Platelets: 307 10*3/uL (ref 150–450)
RBC: 5.49 x10E6/uL (ref 4.14–5.80)
RDW: 12.5 % (ref 11.6–15.4)
WBC: 4.1 10*3/uL (ref 3.4–10.8)

## 2022-05-06 LAB — LIPID PANEL
Chol/HDL Ratio: 3.4 ratio (ref 0.0–5.0)
Cholesterol, Total: 195 mg/dL (ref 100–199)
HDL: 58 mg/dL (ref >39)
LDL Chol Calc (NIH): 120 mg/dL — ABNORMAL HIGH (ref 0–99)
Triglycerides: 97 mg/dL (ref 0–149)
VLDL Cholesterol Cal: 17 mg/dL (ref 5–40)

## 2022-05-06 LAB — HEMOGLOBIN A1C
Est. average glucose Bld gHb Est-mCnc: 326 mg/dL
Hgb A1c MFr Bld: 13 % — ABNORMAL HIGH (ref 4.8–5.6)

## 2022-05-06 LAB — MICROALBUMIN / CREATININE URINE RATIO
Creatinine, Urine: 86.8 mg/dL
Microalb/Creat Ratio: 81 mg/g creat — ABNORMAL HIGH (ref 0–29)
Microalbumin, Urine: 70.4 ug/mL

## 2022-05-10 ENCOUNTER — Other Ambulatory Visit: Payer: Self-pay

## 2022-05-17 ENCOUNTER — Other Ambulatory Visit: Payer: Self-pay

## 2022-05-24 ENCOUNTER — Other Ambulatory Visit: Payer: Self-pay

## 2022-05-25 ENCOUNTER — Telehealth: Payer: Self-pay | Admitting: Pharmacist

## 2022-05-25 NOTE — Progress Notes (Signed)
Patient attempted to be outreached by Michiel Cowboy, PharmD Candidate on 05/25/2022 to discuss hypertension. Voicemail box full, so no message was left.   Michiel Cowboy, PharmD Candidate    Catie Eppie Gibson, PharmD, BCACP, CPP [facility] [phone]

## 2022-05-28 ENCOUNTER — Other Ambulatory Visit: Payer: Self-pay

## 2022-06-02 ENCOUNTER — Ambulatory Visit: Payer: Medicaid Other | Attending: Nurse Practitioner | Admitting: Nurse Practitioner

## 2022-06-11 ENCOUNTER — Other Ambulatory Visit: Payer: Self-pay

## 2022-06-24 ENCOUNTER — Other Ambulatory Visit: Payer: Self-pay

## 2022-06-24 ENCOUNTER — Other Ambulatory Visit: Payer: Self-pay | Admitting: Pharmacist

## 2022-06-24 MED ORDER — LANTUS SOLOSTAR 100 UNIT/ML ~~LOC~~ SOPN
26.0000 [IU] | PEN_INJECTOR | Freq: Every day | SUBCUTANEOUS | 2 refills | Status: DC
  Filled 2022-06-24: qty 9, 34d supply, fill #0
  Filled 2022-07-27: qty 9, 34d supply, fill #1
  Filled 2022-09-13: qty 9, 34d supply, fill #2

## 2022-07-01 ENCOUNTER — Other Ambulatory Visit: Payer: Self-pay

## 2022-07-05 ENCOUNTER — Other Ambulatory Visit: Payer: Self-pay

## 2022-07-07 ENCOUNTER — Ambulatory Visit (HOSPITAL_COMMUNITY): Payer: Medicaid Other | Admitting: Student

## 2022-07-07 ENCOUNTER — Ambulatory Visit (HOSPITAL_BASED_OUTPATIENT_CLINIC_OR_DEPARTMENT_OTHER): Payer: Medicaid Other | Admitting: Orthopaedic Surgery

## 2022-07-13 ENCOUNTER — Other Ambulatory Visit: Payer: Self-pay

## 2022-07-13 ENCOUNTER — Other Ambulatory Visit: Payer: Self-pay | Admitting: Nurse Practitioner

## 2022-07-13 DIAGNOSIS — E1165 Type 2 diabetes mellitus with hyperglycemia: Secondary | ICD-10-CM

## 2022-07-14 ENCOUNTER — Other Ambulatory Visit: Payer: Self-pay

## 2022-07-14 MED ORDER — OZEMPIC (1 MG/DOSE) 4 MG/3ML ~~LOC~~ SOPN
1.0000 mg | PEN_INJECTOR | SUBCUTANEOUS | 0 refills | Status: DC
  Filled 2022-07-14: qty 3, 28d supply, fill #0

## 2022-07-15 ENCOUNTER — Other Ambulatory Visit: Payer: Self-pay

## 2022-07-26 ENCOUNTER — Other Ambulatory Visit: Payer: Self-pay

## 2022-07-27 ENCOUNTER — Other Ambulatory Visit: Payer: Self-pay | Admitting: Nurse Practitioner

## 2022-07-27 ENCOUNTER — Other Ambulatory Visit: Payer: Self-pay

## 2022-07-27 DIAGNOSIS — J302 Other seasonal allergic rhinitis: Secondary | ICD-10-CM

## 2022-07-27 MED ORDER — FLUTICASONE PROPIONATE 50 MCG/ACT NA SUSP
1.0000 | Freq: Every day | NASAL | 0 refills | Status: DC
  Filled 2022-07-27: qty 16, 60d supply, fill #0

## 2022-07-29 ENCOUNTER — Other Ambulatory Visit: Payer: Self-pay

## 2022-07-30 ENCOUNTER — Other Ambulatory Visit: Payer: Self-pay

## 2022-08-06 ENCOUNTER — Other Ambulatory Visit: Payer: Self-pay

## 2022-08-06 ENCOUNTER — Emergency Department (HOSPITAL_COMMUNITY): Payer: Medicaid Other

## 2022-08-06 ENCOUNTER — Emergency Department (HOSPITAL_COMMUNITY)
Admission: EM | Admit: 2022-08-06 | Discharge: 2022-08-06 | Disposition: A | Discharge status: Home / Self Care | Payer: Medicaid Other | Attending: Emergency Medicine | Admitting: Emergency Medicine

## 2022-08-06 DIAGNOSIS — Z7984 Long term (current) use of oral hypoglycemic drugs: Secondary | ICD-10-CM | POA: Diagnosis not present

## 2022-08-06 DIAGNOSIS — Z79899 Other long term (current) drug therapy: Secondary | ICD-10-CM | POA: Insufficient documentation

## 2022-08-06 DIAGNOSIS — I10 Essential (primary) hypertension: Secondary | ICD-10-CM | POA: Diagnosis not present

## 2022-08-06 DIAGNOSIS — E119 Type 2 diabetes mellitus without complications: Secondary | ICD-10-CM | POA: Diagnosis not present

## 2022-08-06 DIAGNOSIS — R09A2 Foreign body sensation, throat: Secondary | ICD-10-CM | POA: Diagnosis not present

## 2022-08-06 DIAGNOSIS — Z794 Long term (current) use of insulin: Secondary | ICD-10-CM | POA: Insufficient documentation

## 2022-08-06 NOTE — Discharge Instructions (Addendum)
Your CT scan was reassuring today. Please take tylenol/ibuprofen for pain. I recommend close follow-up with PCP for reevaluation.  Please do not hesitate to return to emergency department if worrisome signs symptoms we discussed become apparent.
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
## 2022-08-06 NOTE — ED Triage Notes (Signed)
Pt arrived via POV. Pt states they had food caught in their throat a few days ago, and it's been irritated since then. Today pot stated they tried to take their pills and they got stuck in their throat.   No issues breathing, and appears to be tolerating secretions

## 2022-08-13 ENCOUNTER — Other Ambulatory Visit: Payer: Self-pay | Admitting: Family Medicine

## 2022-08-13 ENCOUNTER — Other Ambulatory Visit: Payer: Self-pay

## 2022-08-13 DIAGNOSIS — E1165 Type 2 diabetes mellitus with hyperglycemia: Secondary | ICD-10-CM

## 2022-08-16 ENCOUNTER — Encounter: Payer: Self-pay | Admitting: Pharmacist

## 2022-08-16 ENCOUNTER — Other Ambulatory Visit: Payer: Self-pay

## 2022-08-18 ENCOUNTER — Encounter: Payer: Self-pay | Admitting: Physician Assistant

## 2022-08-18 ENCOUNTER — Other Ambulatory Visit: Payer: Self-pay

## 2022-08-18 ENCOUNTER — Ambulatory Visit: Payer: Medicaid Other | Attending: Physician Assistant | Admitting: Physician Assistant

## 2022-08-18 VITALS — BP 141/85 | HR 108 | Temp 98.2°F | Ht 67.0 in | Wt 247.0 lb

## 2022-08-18 DIAGNOSIS — F411 Generalized anxiety disorder: Secondary | ICD-10-CM

## 2022-08-18 DIAGNOSIS — N529 Male erectile dysfunction, unspecified: Secondary | ICD-10-CM | POA: Diagnosis not present

## 2022-08-18 DIAGNOSIS — J302 Other seasonal allergic rhinitis: Secondary | ICD-10-CM

## 2022-08-18 DIAGNOSIS — R140 Abdominal distension (gaseous): Secondary | ICD-10-CM

## 2022-08-18 DIAGNOSIS — I10 Essential (primary) hypertension: Secondary | ICD-10-CM

## 2022-08-18 DIAGNOSIS — E1165 Type 2 diabetes mellitus with hyperglycemia: Secondary | ICD-10-CM | POA: Diagnosis not present

## 2022-08-18 DIAGNOSIS — Z794 Long term (current) use of insulin: Secondary | ICD-10-CM | POA: Diagnosis not present

## 2022-08-18 LAB — POCT URINALYSIS DIP (CLINITEK)
Bilirubin, UA: NEGATIVE
Blood, UA: NEGATIVE
Glucose, UA: >=1000 mg/dL — AB
Leukocytes, UA: NEGATIVE
Nitrite, UA: NEGATIVE
POC PROTEIN,UA: NEGATIVE
Spec Grav, UA: 1.015 (ref 1.010–1.025)
Urobilinogen, UA: 0.2 E.U./dL
pH, UA: 5.5 (ref 5.0–8.0)

## 2022-08-18 LAB — POCT GLYCOSYLATED HEMOGLOBIN (HGB A1C): HbA1c, POC (controlled diabetic range): 8.1 % — AB (ref 0.0–7.0)

## 2022-08-18 LAB — GLUCOSE, POCT (MANUAL RESULT ENTRY): POC Glucose: 384 mg/dl — AB (ref 70–99)

## 2022-08-18 MED ORDER — LOSARTAN POTASSIUM 100 MG PO TABS
100.0000 mg | ORAL_TABLET | Freq: Every day | ORAL | 1 refills | Status: DC
Start: 2022-08-18 — End: 2022-10-22
  Filled 2022-08-18 – 2022-08-27 (×2): qty 90, 90d supply, fill #0
  Filled 2022-09-06: qty 90, 90d supply, fill #1

## 2022-08-18 MED ORDER — SEMAGLUTIDE (2 MG/DOSE) 8 MG/3ML ~~LOC~~ SOPN
2.0000 mg | PEN_INJECTOR | SUBCUTANEOUS | 2 refills | Status: DC
  Filled 2022-08-18: qty 3, 28d supply, fill #0
  Filled 2022-09-24: qty 3, 28d supply, fill #1
  Filled 2022-10-11 – 2022-10-15 (×2): qty 3, 28d supply, fill #2

## 2022-08-18 MED ORDER — METFORMIN HCL 500 MG PO TABS
1000.0000 mg | ORAL_TABLET | Freq: Two times a day (BID) | ORAL | 1 refills | Status: DC
Start: 2022-08-18 — End: 2022-11-29
  Filled 2022-08-18 – 2022-08-27 (×2): qty 180, 45d supply, fill #0
  Filled 2022-10-11: qty 180, 45d supply, fill #1

## 2022-08-18 MED ORDER — GABAPENTIN 300 MG PO CAPS
300.0000 mg | ORAL_CAPSULE | Freq: Every day | ORAL | 0 refills | Status: DC
Start: 2022-08-18 — End: 2022-10-20
  Filled 2022-08-18 – 2022-08-27 (×2): qty 90, 90d supply, fill #0

## 2022-08-18 MED ORDER — FLUTICASONE PROPIONATE 50 MCG/ACT NA SUSP
1.0000 | Freq: Every day | NASAL | 2 refills | Status: DC
Start: 2022-08-18 — End: 2023-05-17
  Filled 2022-08-18: qty 16, 60d supply, fill #0
  Filled 2022-08-27: qty 16, 30d supply, fill #0

## 2022-08-18 MED ORDER — TADALAFIL 20 MG PO TABS
10.0000 mg | ORAL_TABLET | ORAL | 1 refills | Status: DC | PRN
Start: 2022-08-18 — End: 2022-10-22
  Filled 2022-08-18 – 2022-08-27 (×2): qty 10, 20d supply, fill #0
  Filled 2022-10-21: qty 10, 9d supply, fill #1

## 2022-08-18 NOTE — Patient Instructions (Addendum)
Simethicone for gas   work at a goal of eliminating sugary drinks, candy, desserts, sweets, refined sugars, processed foods, and white carbohydrates as you have gained weight since your last visit.    Drink 80-100 ounces water daily

## 2022-08-18 NOTE — Progress Notes (Signed)
Patient ID: Alan Williams, male   DOB: January 11, 1981, 42 y.o.   MRN: [id number]   Kartik Sargsyan, is a 42 y.o. male  [id number]:[id number]  [id number]:[id number]  DOB - May 24, 1980  Chief Complaint  Patient presents with   Diabetes    DM f/u. Med refill.         Subjective:   Alan Williams is a 42 y.o. male here today for RF of some of his meds.  He has not had ozempic in 2 weeks.  He is taking 26 units lantus daily and forgot to take metformin this morning after eating cookies.  He has recently started making dietary changes and eating less carbs.  He is trying to get more active but the heat is getting to him.  Buspar and lexapro helping with anxiety/depression.  Does not drink much water.  He will take meds today as soon as he picks them up.  No vision changes.    No problems updated.  ALLERGIES: Allergies  Allergen Reactions   Bee Venom Swelling   Shellfish Allergy Swelling    Tongue and throat swelling.   Contrast Media [Iodinated Contrast Media] Itching    PAST MEDICAL HISTORY: Past Medical History:  Diagnosis Date   Diabetes mellitus without complication (HCC)    Diverticulitis    Hypertension     MEDICATIONS AT HOME: Prior to Admission medications   Medication Sig Start Date End Date Taking? Authorizing Provider  amLODipine (NORVASC) 10 MG tablet Take 1 tablet (10 mg total) by mouth daily. 05/05/22  Yes Claiborne Rigg, NP  atorvastatin (LIPITOR) 40 MG tablet Take 1 tablet (40 mg total) by mouth daily. After evening meal to lower cholesterol 05/05/22  Yes Claiborne Rigg, NP  Blood Glucose Monitoring Suppl (TRUE METRIX METER) w/Device KIT Use as directed to check blood sugars 3 times daily. 07/20/21  Yes Claiborne Rigg, NP  busPIRone (BUSPAR) 15 MG tablet Take 1 tablet (15 mg total) by mouth 2 (two) times daily. 05/05/22  Yes Claiborne Rigg, NP  escitalopram (LEXAPRO) 10 MG tablet Take 1 tablet (10 mg total) by mouth daily. For depression 05/05/22  Yes Claiborne Rigg, NP  glucose blood  (TRUE METRIX BLOOD GLUCOSE TEST) test strip Use as directed to check blood sugars 3 times daily. 05/05/22  Yes Claiborne Rigg, NP  insulin glargine (LANTUS SOLOSTAR) 100 UNIT/ML Solostar Pen Inject 26 Units into the skin daily. 06/24/22  Yes Hoy Register, MD  Insulin Pen Needle (TRUEPLUS 5-BEVEL PEN NEEDLES) 31G X 5 MM MISC use as directed to inject insulin into skin once  nightly 05/05/22  Yes Claiborne Rigg, NP  omeprazole (PRILOSEC) 20 MG capsule Take 1 capsule (20 mg total) by mouth 2 (two) times daily before a meal. 05/05/22  Yes Claiborne Rigg, NP  Semaglutide, 2 MG/DOSE, 8 MG/3ML SOPN Inject 2 mg as directed once a week. 08/18/22  Yes Anders Simmonds, PA-C  traZODone (DESYREL) 100 MG tablet Take 1 tablet (100 mg total) by mouth at bedtime. FOR SLEEP 05/05/22  Yes Claiborne Rigg, NP  TRUEplus Lancets 28G MISC Use as directed to check blood sugars 3 times daily. 05/05/22  Yes Claiborne Rigg, NP  fluticasone (FLONASE) 50 MCG/ACT nasal spray Place 1 spray into both nostrils daily. 08/18/22   Anders Simmonds, PA-C  gabapentin (NEURONTIN) 300 MG capsule Take 1 capsule (300 mg total) by mouth at bedtime. 08/18/22   Anders Simmonds, PA-C  losartan (COZAAR) 100 MG  tablet Take 1 tablet (100 mg total) by mouth daily. 08/18/22   Anders Simmonds, PA-C  metFORMIN (GLUCOPHAGE) 500 MG tablet Take 2 tablets (1,000 mg total) by mouth 2 (two) times daily with a meal. 08/18/22   Tykerria Mccubbins, Marzella Schlein, PA-C  tadalafil (CIALIS) 20 MG tablet Take 0.5-1 tablets (10-20 mg total) by mouth every other day as needed for erectile dysfunction. 08/18/22   Anders Simmonds, PA-C  Vitamin D, Ergocalciferol, (DRISDOL) 1.25 MG (50000 UNIT) CAPS capsule Take 1 capsule (50,000 Units total) by mouth every 7 (seven) days. Patient not taking: Reported on 08/18/2022 06/03/20   [name], [name], PA-C    ROS: Neg HEENT Neg resp Neg cardiac Neg GI Neg GU Neg MS Neg psych Neg neuro  Objective:   Vitals:   08/18/22 1406  BP: (!)  141/85  Pulse: (!) 108  Temp: 98.2 F (36.8 C)  TempSrc: Oral  SpO2: 95%  Weight: 247 lb (112 kg)  Height: 5\' 7"  (1.702 m)   Exam General appearance : Awake, alert, not in any distress. Speech Clear. Not toxic looking HEENT: Atraumatic and Normocephalic Neck: Supple, no JVD. No cervical lymphadenopathy.  Chest: Good air entry bilaterally, CTAB.  No rales/rhonchi/wheezing CVS: S1 S2 regular, no murmurs.  Extremities: B/L Lower Ext shows no edema, both legs are warm to touch Neurology: Awake alert, and oriented X 3, CN II-XII intact, Non focal Skin: No Rash  Data Review Lab Results  Component Value Date   HGBA1C 8.1 (A) 08/18/2022   HGBA1C 13.0 (H) 05/05/2022   HGBA1C 10.2 (A) 07/20/2021    Assessment & Plan   1. Type 2 diabetes mellitus with hyperglycemia, with long-term current use of insulin (HCC) Did not take meds today.  Has not had ozempic in 2 weeks.  Much improved A1C from 13.0 to 8.1.  will increase water intake and increase ozempic.  Continue metformin and lantus.  He drank 10 0unces water here due to trace ketone - Glucose (CBG) - HgB A1c - POCT URINALYSIS DIP (CLINITEK) - metFORMIN (GLUCOPHAGE) 500 MG tablet; Take 2 tablets (1,000 mg total) by mouth 2 (two) times daily with a meal.  Dispense: 180 tablet; Refill: 1 - gabapentin (NEURONTIN) 300 MG capsule; Take 1 capsule (300 mg total) by mouth at bedtime.  Dispense: 90 capsule; Refill: 0  2. Erectile dysfunction, unspecified erectile dysfunction type - tadalafil (CIALIS) 20 MG tablet; Take 0.5-1 tablets (10-20 mg total) by mouth every other day as needed for erectile dysfunction.  Dispense: 10 tablet; Refill: 1  3. Primary hypertension Did not take today - losartan (COZAAR) 100 MG tablet; Take 1 tablet (100 mg total) by mouth daily.  Dispense: 90 tablet; Refill: 1  4. Seasonal allergies - fluticasone (FLONASE) 50 MCG/ACT nasal spray; Place 1 spray into both nostrils daily.  Dispense: 48 g; Refill: 2  5.  Bloating simethicone  6. GAD (generalized anxiety disorder) Stable on buspar and lexapro    Return for [name] for chronic conditions in 3 to 4 months.  The patient was given clear instructions to go to ER or return to medical center if symptoms don't improve, worsen or new problems develop. The patient verbalized understanding. The patient was told to call to get lab results if they haven't heard anything in the next week.      Georgian Co, PA-C [facility] [place], [place] [phone]   08/18/2022, 2:38 PM

## 2022-08-19 LAB — COMPREHENSIVE METABOLIC PANEL
ALT: 36 IU/L (ref 0–44)
AST: 29 IU/L (ref 0–40)
Albumin: 4.6 g/dL (ref 4.1–5.1)
Alkaline Phosphatase: 86 IU/L (ref 44–121)
BUN/Creatinine Ratio: 14 (ref 9–20)
BUN: 14 mg/dL (ref 6–24)
Bilirubin Total: 0.3 mg/dL (ref 0.0–1.2)
CO2: 27 mmol/L (ref 20–29)
Calcium: 9.6 mg/dL (ref 8.7–10.2)
Chloride: 99 mmol/L (ref 96–106)
Creatinine, Ser: 1.03 mg/dL (ref 0.76–1.27)
Globulin, Total: 2.5 g/dL (ref 1.5–4.5)
Glucose: 312 mg/dL — ABNORMAL HIGH (ref 70–99)
Potassium: 5 mmol/L (ref 3.5–5.2)
Sodium: 139 mmol/L (ref 134–144)
Total Protein: 7.1 g/dL (ref 6.0–8.5)
eGFR: 92 mL/min/{1.73_m2} (ref >59)

## 2022-08-20 ENCOUNTER — Other Ambulatory Visit: Payer: Self-pay

## 2022-08-23 ENCOUNTER — Other Ambulatory Visit: Payer: Self-pay

## 2022-08-24 ENCOUNTER — Other Ambulatory Visit: Payer: Self-pay

## 2022-08-25 ENCOUNTER — Other Ambulatory Visit: Payer: Self-pay

## 2022-08-27 ENCOUNTER — Other Ambulatory Visit: Payer: Self-pay

## 2022-08-27 ENCOUNTER — Telehealth: Payer: Self-pay | Admitting: Nurse Practitioner

## 2022-08-27 NOTE — Telephone Encounter (Signed)
Patient stated at his last visit on 7.3.24 that the provider stated he would need a prior auth for medication, Ozempic. Patient was calling to check the status of the PA. Please document status and let patient know as well.   Patient's callback #: [phone]

## 2022-08-27 NOTE — Telephone Encounter (Signed)
Returned patient's call today and had to leave a message. Informed patient that prior Berkley Harvey was approved and medication is ready for pick-up at the pharmacy.

## 2022-09-06 ENCOUNTER — Other Ambulatory Visit: Payer: Self-pay

## 2022-09-10 ENCOUNTER — Other Ambulatory Visit: Payer: Self-pay

## 2022-09-13 ENCOUNTER — Other Ambulatory Visit: Payer: Self-pay

## 2022-09-27 ENCOUNTER — Other Ambulatory Visit: Payer: Self-pay

## 2022-09-30 ENCOUNTER — Other Ambulatory Visit: Payer: Self-pay

## 2022-10-11 ENCOUNTER — Other Ambulatory Visit: Payer: Self-pay | Admitting: Family Medicine

## 2022-10-12 ENCOUNTER — Other Ambulatory Visit: Payer: Self-pay

## 2022-10-12 MED ORDER — LANTUS SOLOSTAR 100 UNIT/ML ~~LOC~~ SOPN
26.0000 [IU] | PEN_INJECTOR | Freq: Every day | SUBCUTANEOUS | 2 refills | Status: DC
  Filled 2022-10-12: qty 9, 34d supply, fill #0
  Filled 2022-12-03: qty 9, 34d supply, fill #1
  Filled 2023-01-14: qty 9, 34d supply, fill #2

## 2022-10-19 ENCOUNTER — Encounter: Payer: Self-pay | Admitting: Pharmacist

## 2022-10-20 ENCOUNTER — Other Ambulatory Visit: Payer: Self-pay

## 2022-10-20 ENCOUNTER — Other Ambulatory Visit: Payer: Self-pay | Admitting: Physician Assistant

## 2022-10-20 ENCOUNTER — Ambulatory Visit: Payer: Medicaid Other | Admitting: Nurse Practitioner

## 2022-10-20 DIAGNOSIS — E1165 Type 2 diabetes mellitus with hyperglycemia: Secondary | ICD-10-CM

## 2022-10-20 MED ORDER — GABAPENTIN 300 MG PO CAPS
300.0000 mg | ORAL_CAPSULE | Freq: Every day | ORAL | 0 refills | Status: DC
Start: 2022-10-20 — End: 2022-10-22
  Filled 2022-10-20: qty 90, 90d supply, fill #0

## 2022-10-22 ENCOUNTER — Ambulatory Visit: Payer: Medicaid Other | Attending: Nurse Practitioner | Admitting: Nurse Practitioner

## 2022-10-22 ENCOUNTER — Other Ambulatory Visit: Payer: Self-pay

## 2022-10-22 ENCOUNTER — Encounter: Payer: Self-pay | Admitting: Nurse Practitioner

## 2022-10-22 VITALS — BP 141/93 | HR 100 | Ht 67.0 in | Wt 245.6 lb

## 2022-10-22 DIAGNOSIS — M25512 Pain in left shoulder: Secondary | ICD-10-CM | POA: Diagnosis not present

## 2022-10-22 DIAGNOSIS — I10 Essential (primary) hypertension: Secondary | ICD-10-CM | POA: Diagnosis not present

## 2022-10-22 DIAGNOSIS — N529 Male erectile dysfunction, unspecified: Secondary | ICD-10-CM

## 2022-10-22 DIAGNOSIS — E1165 Type 2 diabetes mellitus with hyperglycemia: Secondary | ICD-10-CM

## 2022-10-22 DIAGNOSIS — E113553 Type 2 diabetes mellitus with stable proliferative diabetic retinopathy, bilateral: Secondary | ICD-10-CM | POA: Diagnosis not present

## 2022-10-22 DIAGNOSIS — Z794 Long term (current) use of insulin: Secondary | ICD-10-CM

## 2022-10-22 DIAGNOSIS — M79651 Pain in right thigh: Secondary | ICD-10-CM

## 2022-10-22 MED ORDER — VARDENAFIL HCL 10 MG PO TABS
10.0000 mg | ORAL_TABLET | Freq: Every day | ORAL | 6 refills | Status: DC | PRN
Start: 2022-10-22 — End: 2023-04-12
  Filled 2022-10-22 – 2023-03-07 (×4): qty 10, 5d supply, fill #0

## 2022-10-22 MED ORDER — LOSARTAN POTASSIUM-HCTZ 100-25 MG PO TABS
1.0000 | ORAL_TABLET | Freq: Every day | ORAL | 1 refills | Status: DC
Start: 2022-10-22 — End: 2022-12-27
  Filled 2022-10-22 – 2022-11-08 (×2): qty 90, 90d supply, fill #0

## 2022-10-22 MED ORDER — MELOXICAM 7.5 MG PO TABS
7.5000 mg | ORAL_TABLET | Freq: Every day | ORAL | 0 refills | Status: DC
Start: 2022-10-22 — End: 2023-05-17
  Filled 2022-10-22 – 2022-11-05 (×2): qty 30, 30d supply, fill #0

## 2022-10-22 MED ORDER — GABAPENTIN 300 MG PO CAPS
300.0000 mg | ORAL_CAPSULE | Freq: Three times a day (TID) | ORAL | 3 refills | Status: DC
  Filled 2022-10-22 – 2023-02-10 (×2): qty 90, 30d supply, fill #0
  Filled 2023-04-17 – 2023-04-22 (×3): qty 90, 30d supply, fill #1
  Filled 2023-05-26: qty 90, 30d supply, fill #2
  Filled 2023-08-22: qty 90, 30d supply, fill #3

## 2022-10-22 MED ORDER — GABAPENTIN 300 MG PO CAPS
300.0000 mg | ORAL_CAPSULE | Freq: Every day | ORAL | 0 refills | Status: DC
Start: 2022-10-22 — End: 2022-10-22
  Filled 2022-10-22: qty 90, 90d supply, fill #0
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
## 2022-10-28 ENCOUNTER — Other Ambulatory Visit: Payer: Self-pay

## 2022-11-05 ENCOUNTER — Other Ambulatory Visit: Payer: Self-pay

## 2022-11-08 ENCOUNTER — Other Ambulatory Visit: Payer: Self-pay

## 2022-11-08 ENCOUNTER — Other Ambulatory Visit (INDEPENDENT_AMBULATORY_CARE_PROVIDER_SITE_OTHER): Payer: Medicaid Other

## 2022-11-08 ENCOUNTER — Encounter: Payer: Self-pay | Admitting: Orthopaedic Surgery

## 2022-11-08 ENCOUNTER — Ambulatory Visit: Payer: Medicaid Other | Admitting: Orthopaedic Surgery

## 2022-11-08 DIAGNOSIS — G8929 Other chronic pain: Secondary | ICD-10-CM | POA: Diagnosis not present

## 2022-11-08 DIAGNOSIS — M25512 Pain in left shoulder: Secondary | ICD-10-CM

## 2022-11-08 MED ORDER — LIDOCAINE HCL 1 % IJ SOLN
3.0000 mL | INTRAMUSCULAR | Status: AC | PRN
Start: 2022-11-08 — End: 2022-11-08
  Administered 2022-11-08: 3 mL

## 2022-11-08 MED ORDER — METHYLPREDNISOLONE ACETATE 40 MG/ML IJ SUSP
40.0000 mg | INTRAMUSCULAR | Status: AC | PRN
Start: 2022-11-08 — End: 2022-11-08
  Administered 2022-11-08: 40 mg via INTRA_ARTICULAR

## 2022-11-08 NOTE — Progress Notes (Signed)
The patient is a 42 year old right-handed gentleman who was [facility] to evaluate chronic left shoulder pain.  He said he hurt his shoulder about 2 years ago at [facility] and then apparently about a month ago a car door hit his left shoulder when he was falling.  He says the shoulder gets stiff and locks up on him.  It has become a chronic pain for him.  I did review his blood glucose in terms of the A1c and back in March his A1c was 13.  It is always run high.  He says more recently it is down between 7 and 8 but I do not have a recording of this and have to go on his word.  He denies any neck pain or any numbness and tingling in his left shoulder.  On exam he does have stiffness with past 90 degrees of abduction with his left shoulder but his internal rotation with adduction is not limited at all.  He is able to reach across in front and his external rotation is also full.  There is no weakness in the rotator cuff.  There is signs of impingement though.  3 views of the left shoulder show no acute findings.  The shoulder is well located.  He states that he and his wife are checking his blood glucose twice daily.  I recommended a steroid injection for his left shoulder subacromial outlet and he agreed to this and tolerated it very well.  He is going to pick up meloxicam that they had prescribed for him from the [facility] and I do believe this will be helpful for him as well.  Again I counseled him about watching his blood glucose in light of this steroid injection.  All questions and concerns were answered and addressed.  Follow-up is as needed.    Procedure Note  Patient: Alan Williams             Date of Birth: 05-20-1980           MRN: [id number]             Visit Date: 11/08/2022  Procedures: Visit Diagnoses:  1. Acute pain of left shoulder   2. Chronic left shoulder pain     Large Joint Inj: L subacromial bursa on  11/08/2022 10:06 AM Indications: pain and diagnostic evaluation Details: 22 G 1.5 in needle  Arthrogram: No  Medications: 3 mL lidocaine 1 %; 40 mg methylPREDNISolone acetate 40 MG/ML Outcome: tolerated well, no immediate complications Procedure, treatment alternatives, risks and benefits explained, specific risks discussed. Consent was given by the patient. Immediately prior to procedure a time out was called to verify the correct patient, procedure, equipment, support staff and site/side marked as required. Patient was prepped and draped in the usual sterile fashion.

## 2022-11-29 ENCOUNTER — Other Ambulatory Visit: Payer: Self-pay

## 2022-11-29 ENCOUNTER — Other Ambulatory Visit: Payer: Self-pay | Admitting: Physician Assistant

## 2022-11-29 DIAGNOSIS — E1165 Type 2 diabetes mellitus with hyperglycemia: Secondary | ICD-10-CM

## 2022-11-29 DIAGNOSIS — N529 Male erectile dysfunction, unspecified: Secondary | ICD-10-CM

## 2022-11-30 ENCOUNTER — Other Ambulatory Visit: Payer: Self-pay

## 2022-11-30 MED ORDER — OZEMPIC (2 MG/DOSE) 8 MG/3ML ~~LOC~~ SOPN
2.0000 mg | PEN_INJECTOR | SUBCUTANEOUS | 0 refills | Status: DC
  Filled 2022-11-30: qty 3, 28d supply, fill #0

## 2022-11-30 MED ORDER — METFORMIN HCL 500 MG PO TABS
1000.0000 mg | ORAL_TABLET | Freq: Two times a day (BID) | ORAL | 0 refills | Status: DC
Start: 2022-11-30 — End: 2023-03-07
  Filled 2022-11-30: qty 360, 90d supply, fill #0

## 2022-11-30 NOTE — Telephone Encounter (Signed)
Requested Prescriptions  Pending Prescriptions Disp Refills   tadalafil (CIALIS) 20 MG tablet 10 tablet 1    Sig: Take 0.5-1 tablets (10-20 mg total) by mouth every other day as needed for erectile dysfunction.     Urology: Erectile Dysfunction Agents Failed - 11/29/2022  2:09 PM      Failed - Last BP in normal range    BP Readings from Last 1 Encounters:  10/22/22 (!) 141/93         Passed - AST in normal range and within 360 days    AST  Date Value Ref Range Status  08/18/2022 29 0 - 40 IU/L Final         Passed - ALT in normal range and within 360 days    ALT  Date Value Ref Range Status  08/18/2022 36 0 - 44 IU/L Final         Passed - Valid encounter within last 12 months    Recent Outpatient Visits           1 month ago Primary hypertension   [place] [facility] [place], [place] W, NP   3 months ago Type 2 diabetes mellitus with hyperglycemia, with long-term current use of insulin [facility])   [place] [facility] [place], [place], [place]   6 months ago Type 2 diabetes mellitus with hyperglycemia, with long-term current use of insulin (HCC)   [place] [facility] [place], [place] W, NP   1 year ago Carpal tunnel syndrome of right wrist    [facility] [place], [place] W, NP   1 year ago Type 2 diabetes mellitus with hyperglycemia, with long-term current use of insulin (HCC)    [facility] [place], Cornelius Moras, RPH-CPP       Future Appointments             In 3 weeks Claiborne Rigg, NP [facility]             metFORMIN (GLUCOPHAGE) 500 MG tablet 360 tablet 0    Sig: Take 2 tablets (1,000 mg total) by mouth 2 (two) times daily with a meal.     Endocrinology:  Diabetes - Biguanides Failed - 11/29/2022  2:09 PM      Failed - HBA1C is between 0 and 7.9 and within 180 days    HbA1c, POC  (controlled diabetic range)  Date Value Ref Range Status  08/18/2022 8.1 (A) 0.0 - 7.0 % Corrected         Failed - B12 Level in normal range and within 720 days    No results found for: "VITAMINB12"       Passed - Cr in normal range and within 360 days    Creatinine, Ser  Date Value Ref Range Status  08/18/2022 1.03 0.76 - 1.27 mg/dL Final   Creatinine,U  Date Value Ref Range Status  08/27/2010 136.7 mg/dL Final    Comment:    (NOTE) Cutoff Values for Urine Drug Screen:        Drug Class           Cutoff (ng/mL)        Amphetamines            1000        Barbiturates             200  Cocaine Metabolites      300        Benzodiazepines          200        Methadone                300        Opiates                 2000        Phencyclidine             25        Propoxyphene             300        Marijuana Metabolites     50 For medical purposes only.         Passed - eGFR in normal range and within 360 days    GFR calc Af Amer  Date Value Ref Range Status  10/11/2019 119 >59 mL/min/1.73 Final    Comment:    **[facility] currently reports eGFR in compliance with the current**   recommendations of the [employer]. [facility] will   update reporting as new guidelines are published from the NKF-ASN   Task force.    GFR calc non Af Amer  Date Value Ref Range Status  10/11/2019 103 >59 mL/min/1.73 Final   eGFR  Date Value Ref Range Status  08/18/2022 92 >59 mL/min/1.73 Final         Passed - Valid encounter within last 6 months    Recent Outpatient Visits           1 month ago Primary hypertension   [facility] [place], [place] W, NP   3 months ago Type 2 diabetes mellitus with hyperglycemia, with long-term current use of insulin [facility])   [place] [facility] [place], [place], [place]   6 months ago Type 2 diabetes mellitus with hyperglycemia, with long-term current use of insulin [facility])    [place] [facility] [place], Shea Stakes, NP   1 year ago Carpal tunnel syndrome of right wrist   [place] [facility] [place], [place] W, NP   1 year ago Type 2 diabetes mellitus with hyperglycemia, with long-term current use of insulin [facility])   [place] [facility] [place], Cornelius Moras, RPH-CPP       Future Appointments             In 3 weeks Claiborne Rigg, NP [facility]            Passed - CBC within normal limits and completed in the last 12 months    WBC  Date Value Ref Range Status  05/05/2022 4.1 3.4 - 10.8 x10E3/uL Final  06/13/2019 6.5 4.0 - 10.5 K/uL Final   RBC  Date Value Ref Range Status  05/05/2022 5.49 4.14 - 5.80 x10E6/uL Final  06/13/2019 5.66 4.22 - 5.81 MIL/uL Final   Hemoglobin  Date Value Ref Range Status  05/05/2022 14.0 13.0 - 17.7 g/dL Final   Hematocrit  Date Value Ref Range Status  05/05/2022 44.7 37.5 - 51.0 % Final   MCHC  Date Value Ref Range Status  05/05/2022 31.3 (L) 31.5 - 35.7 g/dL Final  54/10/8117 14.7 30.0 - 36.0 g/dL Final   [facility]  Date Value Ref Range Status  05/05/2022 25.5 (L)  26.6 - 33.0 pg Final  06/13/2019 26.3 26.0 - 34.0 pg Final   MCV  Date Value Ref Range Status  05/05/2022 81 79 - 97 fL Final   No results found for: "PLTCOUNTKUC", "LABPLAT", "POCPLA" RDW  Date Value Ref Range Status  05/05/2022 12.5 11.6 - 15.4 % Final          Semaglutide, 2 MG/DOSE, (OZEMPIC, 2 MG/DOSE,) 8 MG/3ML SOPN 3 mL 0    Sig: Inject 2 mg as directed once a week.     Endocrinology:  Diabetes - GLP-1 Receptor Agonists - semaglutide Failed - 11/29/2022  2:09 PM      Failed - HBA1C in normal range and within 180 days    HbA1c, POC (controlled diabetic range)  Date Value Ref Range Status  08/18/2022 8.1 (A) 0.0 - 7.0 % Corrected         Passed - Cr in normal range and within 360 days    Creatinine, Ser  Date Value Ref  Range Status  08/18/2022 1.03 0.76 - 1.27 mg/dL Final   Creatinine,U  Date Value Ref Range Status  08/27/2010 136.7 mg/dL Final    Comment:    (NOTE) Cutoff Values for Urine Drug Screen:        Drug Class           Cutoff (ng/mL)        Amphetamines            1000        Barbiturates             200        Cocaine Metabolites      300        Benzodiazepines          200        Methadone                300        Opiates                 2000        Phencyclidine             25        Propoxyphene             300        Marijuana Metabolites     50 For medical purposes only.         Passed - Valid encounter within last 6 months    Recent Outpatient Visits           1 month ago Primary hypertension   [place] [place], [place] W, NP   3 months ago Type 2 diabetes mellitus with hyperglycemia, with long-term current use of insulin [facility])   [place] [facility] [place], [place], [place]   6 months ago Type 2 diabetes mellitus with hyperglycemia, with long-term current use of insulin [facility])   [place] [place], Shea Stakes, NP   1 year ago Carpal tunnel syndrome of right wrist   [place] [facility] [place], [place] W, NP   1 year ago Type 2 diabetes mellitus with hyperglycemia, with long-term current use of insulin [facility])   [place] [facility] Drucilla Chalet, RPH-CPP       Future Appointments  In 3 weeks Claiborne Rigg, NP [facility]

## 2022-12-01 ENCOUNTER — Other Ambulatory Visit: Payer: Self-pay

## 2022-12-03 ENCOUNTER — Other Ambulatory Visit: Payer: Self-pay

## 2022-12-20 ENCOUNTER — Other Ambulatory Visit: Payer: Self-pay

## 2022-12-20 ENCOUNTER — Other Ambulatory Visit: Payer: Self-pay | Admitting: Nurse Practitioner

## 2022-12-20 DIAGNOSIS — I10 Essential (primary) hypertension: Secondary | ICD-10-CM

## 2022-12-20 DIAGNOSIS — E785 Hyperlipidemia, unspecified: Secondary | ICD-10-CM

## 2022-12-21 ENCOUNTER — Other Ambulatory Visit: Payer: Self-pay

## 2022-12-21 MED ORDER — ATORVASTATIN CALCIUM 40 MG PO TABS
40.0000 mg | ORAL_TABLET | Freq: Every day | ORAL | 0 refills | Status: DC
Start: 2022-12-21 — End: 2023-04-17
  Filled 2022-12-21 – 2023-01-14 (×2): qty 90, 90d supply, fill #0

## 2022-12-21 MED ORDER — AMLODIPINE BESYLATE 10 MG PO TABS
10.0000 mg | ORAL_TABLET | Freq: Every day | ORAL | 0 refills | Status: DC
Start: 2022-12-21 — End: 2023-04-17
  Filled 2022-12-21 – 2023-01-10 (×2): qty 90, 90d supply, fill #0

## 2022-12-21 NOTE — Telephone Encounter (Signed)
Requested Prescriptions  Pending Prescriptions Disp Refills   amLODipine (NORVASC) 10 MG tablet 90 tablet 0    Sig: Take 1 tablet (10 mg total) by mouth daily.     Cardiovascular: Calcium Channel Blockers 2 Failed - 12/20/2022 10:35 AM      Failed - Last BP in normal range    BP Readings from Last 1 Encounters:  10/22/22 (!) 141/93         Passed - Last Heart Rate in normal range    Pulse Readings from Last 1 Encounters:  10/22/22 100         Passed - Valid encounter within last 6 months    Recent Outpatient Visits           2 months ago Primary hypertension   [facility] [place]. [facility] [place], [place] W, NP   4 months ago Type 2 diabetes mellitus with hyperglycemia, with long-term current use of insulin (HCC)   [facility] [place]. [facility], Marylene [place], [place]   7 months ago Type 2 diabetes mellitus with hyperglycemia, with long-term current use of insulin [facility])   [place] [facility] [place]. [facility] Claiborne Rigg, NP   1 year ago Carpal tunnel syndrome of right wrist   [place] [facility] [place]. [facility] [place], [place] W, NP   1 year ago Type 2 diabetes mellitus with hyperglycemia, with long-term current use of insulin (HCC)   [place] [facility] [place]. [facility] Drucilla Chalet, RPH-CPP       Future Appointments             In 6 days Claiborne Rigg, NP [place] [facility] [place]. [facility]             atorvastatin (LIPITOR) 40 MG tablet 90 tablet 0    Sig: Take 1 tablet (40 mg total) by mouth daily. After evening meal to lower cholesterol     Cardiovascular:  Antilipid - Statins Failed - 12/20/2022 10:35 AM      Failed - Lipid Panel in normal range within the last 12 months    Cholesterol, Total  Date  Value Ref Range Status  05/05/2022 195 100 - 199 mg/dL Final   LDL Chol Calc (NIH)  Date Value Ref Range Status  05/05/2022 120 (H) 0 - 99 mg/dL Final   HDL  Date Value Ref Range Status  05/05/2022 58 >39 mg/dL Final   Triglycerides  Date Value Ref Range Status  05/05/2022 97 0 - 149 mg/dL Final         Passed - Patient is not pregnant      Passed - Valid encounter within last 12 months    Recent Outpatient Visits           2 months ago Primary hypertension   [place] [facility] [place]. [facility] [place], [place] W, NP   4 months ago Type 2 diabetes mellitus with hyperglycemia, with long-term current use of insulin (HCC)   [facility] . [facility] [place], [place], [place]   7 months ago Type 2 diabetes mellitus with  hyperglycemia, with long-term current use of insulin (HCC)   [place] [facility] [place] Claiborne Rigg, NP   1 year ago Carpal tunnel syndrome of right wrist   [facility] [place]. [facility] [place], [place] W, NP   1 year ago Type 2 diabetes mellitus with hyperglycemia, with long-term current use of insulin (HCC)   [place] [facility] [place]. [facility] Drucilla Chalet, RPH-CPP       Future Appointments             In 6 days Claiborne Rigg, NP [facility] [facility] [place]. [facility]

## 2022-12-27 ENCOUNTER — Other Ambulatory Visit: Payer: Self-pay

## 2022-12-27 ENCOUNTER — Encounter: Payer: Self-pay | Admitting: Nurse Practitioner

## 2022-12-27 ENCOUNTER — Ambulatory Visit: Payer: Medicaid Other | Attending: Nurse Practitioner | Admitting: Nurse Practitioner

## 2022-12-27 VITALS — BP 154/91 | HR 90 | Ht 67.0 in | Wt 246.0 lb

## 2022-12-27 DIAGNOSIS — I10 Essential (primary) hypertension: Secondary | ICD-10-CM

## 2022-12-27 DIAGNOSIS — E559 Vitamin D deficiency, unspecified: Secondary | ICD-10-CM

## 2022-12-27 DIAGNOSIS — E1165 Type 2 diabetes mellitus with hyperglycemia: Secondary | ICD-10-CM | POA: Diagnosis not present

## 2022-12-27 DIAGNOSIS — Z794 Long term (current) use of insulin: Secondary | ICD-10-CM

## 2022-12-27 LAB — POCT GLYCOSYLATED HEMOGLOBIN (HGB A1C): Hemoglobin A1C: 7.4 % — AB (ref 4.0–5.6)

## 2022-12-27 MED ORDER — BLOOD PRESSURE MONITOR DEVI
0 refills | Status: AC
Start: 2022-12-27 — End: ?

## 2022-12-27 MED ORDER — VALSARTAN-HYDROCHLOROTHIAZIDE 160-25 MG PO TABS
1.0000 | ORAL_TABLET | Freq: Every day | ORAL | 3 refills | Status: DC
Start: 2022-12-27 — End: 2023-11-11
  Filled 2022-12-27 – 2023-03-07 (×2): qty 90, 90d supply, fill #0
  Filled 2023-08-10 (×2): qty 90, 90d supply, fill #1
  Filled 2023-10-19: qty 90, 90d supply, fill #2
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
## 2022-12-27 NOTE — Patient Instructions (Signed)
[facility] blood pressure machine [phone]

## 2022-12-28 LAB — CMP14+EGFR
ALT: 27 [IU]/L (ref 0–44)
AST: 24 [IU]/L (ref 0–40)
Albumin: 4.4 g/dL (ref 4.1–5.1)
Alkaline Phosphatase: 72 [IU]/L (ref 44–121)
BUN/Creatinine Ratio: 16 (ref 9–20)
BUN: 15 mg/dL (ref 6–24)
Bilirubin Total: 0.6 mg/dL (ref 0.0–1.2)
CO2: 27 mmol/L (ref 20–29)
Calcium: 9.8 mg/dL (ref 8.7–10.2)
Chloride: 100 mmol/L (ref 96–106)
Creatinine, Ser: 0.94 mg/dL (ref 0.76–1.27)
Globulin, Total: 2.7 g/dL (ref 1.5–4.5)
Glucose: 95 mg/dL (ref 70–99)
Potassium: 4.8 mmol/L (ref 3.5–5.2)
Sodium: 140 mmol/L (ref 134–144)
Total Protein: 7.1 g/dL (ref 6.0–8.5)
eGFR: 103 mL/min/{1.73_m2} (ref >59)

## 2022-12-28 LAB — VITAMIN D 25 HYDROXY (VIT D DEFICIENCY, FRACTURES): Vit D, 25-Hydroxy: 8.6 ng/mL — ABNORMAL LOW (ref 30.0–100.0)

## 2022-12-30 ENCOUNTER — Other Ambulatory Visit: Payer: Self-pay

## 2023-01-01 ENCOUNTER — Other Ambulatory Visit: Payer: Self-pay | Admitting: Nurse Practitioner

## 2023-01-01 DIAGNOSIS — E559 Vitamin D deficiency, unspecified: Secondary | ICD-10-CM

## 2023-01-01 MED ORDER — VITAMIN D (ERGOCALCIFEROL) 1.25 MG (50000 UNIT) PO CAPS
50000.0000 [IU] | ORAL_CAPSULE | ORAL | 0 refills | Status: DC
Start: 2023-01-01 — End: 2023-05-17
  Filled 2023-01-01 – 2023-01-14 (×2): qty 12, 84d supply, fill #0

## 2023-01-03 ENCOUNTER — Other Ambulatory Visit: Payer: Self-pay

## 2023-01-05 ENCOUNTER — Other Ambulatory Visit: Payer: Self-pay

## 2023-01-10 ENCOUNTER — Other Ambulatory Visit: Payer: Self-pay | Admitting: Physician Assistant

## 2023-01-10 ENCOUNTER — Other Ambulatory Visit: Payer: Self-pay | Admitting: Nurse Practitioner

## 2023-01-10 ENCOUNTER — Other Ambulatory Visit: Payer: Self-pay

## 2023-01-10 DIAGNOSIS — N529 Male erectile dysfunction, unspecified: Secondary | ICD-10-CM

## 2023-01-10 MED ORDER — OZEMPIC (2 MG/DOSE) 8 MG/3ML ~~LOC~~ SOPN
2.0000 mg | PEN_INJECTOR | SUBCUTANEOUS | 2 refills | Status: DC
  Filled 2023-01-10: qty 3, 28d supply, fill #0
  Filled 2023-02-10: qty 3, 28d supply, fill #1
  Filled 2023-03-07 (×2): qty 3, 28d supply, fill #2

## 2023-01-11 ENCOUNTER — Other Ambulatory Visit: Payer: Self-pay

## 2023-01-12 ENCOUNTER — Other Ambulatory Visit: Payer: Self-pay

## 2023-01-14 ENCOUNTER — Other Ambulatory Visit: Payer: Self-pay

## 2023-01-19 ENCOUNTER — Ambulatory Visit: Payer: Medicaid Other | Admitting: Orthopaedic Surgery

## 2023-01-24 ENCOUNTER — Ambulatory Visit: Payer: Medicaid Other | Admitting: Orthopaedic Surgery

## 2023-02-03 ENCOUNTER — Other Ambulatory Visit: Payer: Self-pay

## 2023-02-10 ENCOUNTER — Other Ambulatory Visit: Payer: Self-pay

## 2023-02-10 ENCOUNTER — Other Ambulatory Visit: Payer: Self-pay | Admitting: Family Medicine

## 2023-02-10 MED ORDER — LANTUS SOLOSTAR 100 UNIT/ML ~~LOC~~ SOPN
26.0000 [IU] | PEN_INJECTOR | Freq: Every day | SUBCUTANEOUS | 2 refills | Status: DC
  Filled 2023-02-10: qty 9, 34d supply, fill #0
  Filled 2023-04-03: qty 9, 34d supply, fill #1

## 2023-02-11 ENCOUNTER — Other Ambulatory Visit: Payer: Self-pay

## 2023-02-28 ENCOUNTER — Ambulatory Visit: Payer: Medicaid Other | Admitting: Orthopaedic Surgery

## 2023-03-07 ENCOUNTER — Other Ambulatory Visit: Payer: Self-pay

## 2023-03-07 ENCOUNTER — Other Ambulatory Visit: Payer: Self-pay | Admitting: Nurse Practitioner

## 2023-03-07 DIAGNOSIS — E1165 Type 2 diabetes mellitus with hyperglycemia: Secondary | ICD-10-CM

## 2023-03-07 MED ORDER — ACCU-CHEK GUIDE W/DEVICE KIT
PACK | 0 refills | Status: AC
Start: 2023-03-07 — End: ?
  Filled 2023-03-07: qty 1, 30d supply, fill #0

## 2023-03-07 MED ORDER — METFORMIN HCL 500 MG PO TABS
1000.0000 mg | ORAL_TABLET | Freq: Two times a day (BID) | ORAL | 0 refills | Status: DC
Start: 2023-03-07 — End: 2023-06-17
  Filled 2023-03-07: qty 360, 90d supply, fill #0

## 2023-03-11 ENCOUNTER — Other Ambulatory Visit: Payer: Self-pay

## 2023-04-03 ENCOUNTER — Other Ambulatory Visit: Payer: Self-pay | Admitting: Family Medicine

## 2023-04-04 ENCOUNTER — Other Ambulatory Visit: Payer: Self-pay

## 2023-04-06 ENCOUNTER — Encounter: Payer: Self-pay | Admitting: Pharmacist

## 2023-04-06 ENCOUNTER — Ambulatory Visit: Payer: Medicaid Other | Admitting: Nurse Practitioner

## 2023-04-06 ENCOUNTER — Other Ambulatory Visit: Payer: Self-pay

## 2023-04-08 ENCOUNTER — Other Ambulatory Visit: Payer: Self-pay

## 2023-04-11 ENCOUNTER — Other Ambulatory Visit: Payer: Self-pay

## 2023-04-11 ENCOUNTER — Other Ambulatory Visit: Payer: Self-pay | Admitting: Nurse Practitioner

## 2023-04-11 DIAGNOSIS — N521 Erectile dysfunction due to diseases classified elsewhere: Secondary | ICD-10-CM

## 2023-04-12 ENCOUNTER — Other Ambulatory Visit: Payer: Self-pay

## 2023-04-12 MED ORDER — SILDENAFIL CITRATE 100 MG PO TABS
50.0000 mg | ORAL_TABLET | Freq: Every day | ORAL | 11 refills | Status: AC | PRN
Start: 2023-04-12 — End: ?
  Filled 2023-04-12: qty 5, 30d supply, fill #0
  Filled 2023-05-13 – 2023-06-08 (×2): qty 5, 30d supply, fill #1
  Filled 2023-07-21: qty 5, 30d supply, fill #2
  Filled 2023-09-15: qty 5, 30d supply, fill #3

## 2023-04-13 ENCOUNTER — Other Ambulatory Visit: Payer: Self-pay

## 2023-04-15 ENCOUNTER — Other Ambulatory Visit: Payer: Self-pay

## 2023-04-17 ENCOUNTER — Other Ambulatory Visit: Payer: Self-pay | Admitting: Nurse Practitioner

## 2023-04-17 DIAGNOSIS — I10 Essential (primary) hypertension: Secondary | ICD-10-CM

## 2023-04-17 DIAGNOSIS — E1169 Type 2 diabetes mellitus with other specified complication: Secondary | ICD-10-CM

## 2023-04-18 ENCOUNTER — Other Ambulatory Visit: Payer: Self-pay

## 2023-04-18 ENCOUNTER — Other Ambulatory Visit (HOSPITAL_COMMUNITY): Payer: Self-pay

## 2023-04-18 MED ORDER — ATORVASTATIN CALCIUM 40 MG PO TABS
40.0000 mg | ORAL_TABLET | Freq: Every day | ORAL | 0 refills | Status: DC
Start: 2023-04-18 — End: 2023-07-21
  Filled 2023-04-18 – 2023-04-22 (×2): qty 90, 90d supply, fill #0

## 2023-04-18 MED ORDER — AMLODIPINE BESYLATE 10 MG PO TABS
10.0000 mg | ORAL_TABLET | Freq: Every day | ORAL | 0 refills | Status: DC
Start: 2023-04-18 — End: 2023-05-10
  Filled 2023-04-18 – 2023-04-22 (×2): qty 90, 90d supply, fill #0

## 2023-04-19 ENCOUNTER — Other Ambulatory Visit (HOSPITAL_COMMUNITY): Payer: Self-pay

## 2023-04-22 ENCOUNTER — Other Ambulatory Visit: Payer: Self-pay

## 2023-04-27 ENCOUNTER — Other Ambulatory Visit (HOSPITAL_COMMUNITY): Payer: Self-pay

## 2023-04-28 ENCOUNTER — Other Ambulatory Visit (HOSPITAL_COMMUNITY): Payer: Self-pay

## 2023-05-05 ENCOUNTER — Telehealth: Payer: Self-pay | Admitting: Nurse Practitioner

## 2023-05-05 NOTE — Telephone Encounter (Signed)
 Called pt top confirm appt. Pt did not answer and VM was left

## 2023-05-05 NOTE — Telephone Encounter (Signed)
 Error

## 2023-05-09 ENCOUNTER — Other Ambulatory Visit: Payer: Self-pay

## 2023-05-09 ENCOUNTER — Other Ambulatory Visit: Payer: Self-pay | Admitting: Nurse Practitioner

## 2023-05-09 DIAGNOSIS — E1165 Type 2 diabetes mellitus with hyperglycemia: Secondary | ICD-10-CM

## 2023-05-10 ENCOUNTER — Ambulatory Visit: Payer: Medicaid Other | Attending: Nurse Practitioner | Admitting: Nurse Practitioner

## 2023-05-10 ENCOUNTER — Encounter: Payer: Self-pay | Admitting: Nurse Practitioner

## 2023-05-10 ENCOUNTER — Other Ambulatory Visit: Payer: Self-pay

## 2023-05-10 VITALS — BP 125/81 | HR 117 | Resp 20 | Ht 67.0 in | Wt 251.0 lb

## 2023-05-10 DIAGNOSIS — I10 Essential (primary) hypertension: Secondary | ICD-10-CM | POA: Diagnosis not present

## 2023-05-10 DIAGNOSIS — Z23 Encounter for immunization: Secondary | ICD-10-CM | POA: Diagnosis not present

## 2023-05-10 DIAGNOSIS — E559 Vitamin D deficiency, unspecified: Secondary | ICD-10-CM | POA: Diagnosis not present

## 2023-05-10 DIAGNOSIS — E1165 Type 2 diabetes mellitus with hyperglycemia: Secondary | ICD-10-CM

## 2023-05-10 DIAGNOSIS — Z794 Long term (current) use of insulin: Secondary | ICD-10-CM | POA: Diagnosis not present

## 2023-05-10 DIAGNOSIS — M79674 Pain in right toe(s): Secondary | ICD-10-CM

## 2023-05-10 LAB — POCT GLYCOSYLATED HEMOGLOBIN (HGB A1C): Hemoglobin A1C: 8.1 % — AB (ref 4.0–5.6)

## 2023-05-10 MED ORDER — AMLODIPINE BESYLATE 10 MG PO TABS
10.0000 mg | ORAL_TABLET | Freq: Every day | ORAL | 1 refills | Status: DC
  Filled 2023-05-10 – 2023-07-21 (×2): qty 90, 90d supply, fill #0
  Filled 2023-10-19: qty 90, 90d supply, fill #1

## 2023-05-10 MED ORDER — TRUEPLUS 5-BEVEL PEN NEEDLES 31G X 5 MM MISC
3 refills | Status: AC
  Filled 2023-05-10: qty 100, 90d supply, fill #0
  Filled 2023-08-22: qty 100, 90d supply, fill #1
  Filled 2023-12-12: qty 100, 90d supply, fill #2

## 2023-05-10 MED ORDER — OZEMPIC (2 MG/DOSE) 8 MG/3ML ~~LOC~~ SOPN
2.0000 mg | PEN_INJECTOR | SUBCUTANEOUS | 2 refills | Status: DC
Start: 2023-05-10 — End: 2023-08-22
  Filled 2023-05-10: qty 3, 28d supply, fill #0
  Filled 2023-06-08: qty 3, 28d supply, fill #1
  Filled 2023-07-21: qty 3, 28d supply, fill #2

## 2023-05-10 NOTE — Progress Notes (Signed)
 No ozempic for 2 weeks also no needles for insulin

## 2023-05-10 NOTE — Progress Notes (Signed)
 I have seen and examined this patient with the advanced practice provider STUDENT and agree with the note below
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
## 2023-05-13 ENCOUNTER — Other Ambulatory Visit: Payer: Self-pay

## 2023-05-17 ENCOUNTER — Encounter: Payer: Self-pay | Admitting: Podiatry

## 2023-05-17 ENCOUNTER — Ambulatory Visit (INDEPENDENT_AMBULATORY_CARE_PROVIDER_SITE_OTHER): Admitting: Podiatry

## 2023-05-17 ENCOUNTER — Ambulatory Visit (INDEPENDENT_AMBULATORY_CARE_PROVIDER_SITE_OTHER)

## 2023-05-17 DIAGNOSIS — M7751 Other enthesopathy of right foot: Secondary | ICD-10-CM

## 2023-05-17 DIAGNOSIS — E1142 Type 2 diabetes mellitus with diabetic polyneuropathy: Secondary | ICD-10-CM | POA: Diagnosis not present

## 2023-05-17 DIAGNOSIS — M7752 Other enthesopathy of left foot: Secondary | ICD-10-CM

## 2023-05-17 MED ORDER — TRIAMCINOLONE ACETONIDE 40 MG/ML IJ SUSP
20.0000 mg | Freq: Once | INTRAMUSCULAR | Status: AC
Start: 2023-05-17 — End: 2023-05-17
  Administered 2023-05-17: 20 mg
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
## 2023-05-26 ENCOUNTER — Other Ambulatory Visit (HOSPITAL_COMMUNITY): Payer: Self-pay

## 2023-05-26 ENCOUNTER — Other Ambulatory Visit: Payer: Self-pay

## 2023-05-26 ENCOUNTER — Telehealth: Payer: Self-pay | Admitting: Nurse Practitioner

## 2023-05-26 ENCOUNTER — Other Ambulatory Visit: Payer: Self-pay | Admitting: Nurse Practitioner

## 2023-05-26 DIAGNOSIS — E1165 Type 2 diabetes mellitus with hyperglycemia: Secondary | ICD-10-CM

## 2023-05-26 MED ORDER — ACCU-CHEK SOFTCLIX LANCETS MISC
6 refills | Status: AC
  Filled 2023-05-26: qty 100, 30d supply, fill #0

## 2023-05-26 MED ORDER — LANTUS SOLOSTAR 100 UNIT/ML ~~LOC~~ SOPN
26.0000 [IU] | PEN_INJECTOR | Freq: Every day | SUBCUTANEOUS | 2 refills | Status: DC
  Filled 2023-05-26: qty 9, 34d supply, fill #0
  Filled 2023-06-28: qty 9, 34d supply, fill #1
  Filled 2023-08-10: qty 9, 34d supply, fill #2

## 2023-05-26 NOTE — Telephone Encounter (Signed)
 Patient came in the office today requesting for a refill for Semaglutide lantus. Please advise.

## 2023-05-26 NOTE — Telephone Encounter (Signed)
 Patient identified by name and date of birth.  Patient aware of refill being sent.

## 2023-06-06 ENCOUNTER — Other Ambulatory Visit: Payer: Self-pay

## 2023-06-08 ENCOUNTER — Other Ambulatory Visit: Payer: Self-pay

## 2023-06-17 ENCOUNTER — Other Ambulatory Visit: Payer: Self-pay

## 2023-06-17 ENCOUNTER — Other Ambulatory Visit: Payer: Self-pay | Admitting: Family Medicine

## 2023-06-17 DIAGNOSIS — Z794 Long term (current) use of insulin: Secondary | ICD-10-CM

## 2023-06-17 MED ORDER — METFORMIN HCL 500 MG PO TABS
1000.0000 mg | ORAL_TABLET | Freq: Two times a day (BID) | ORAL | 0 refills | Status: DC
  Filled 2023-06-17: qty 360, 90d supply, fill #0

## 2023-06-20 ENCOUNTER — Other Ambulatory Visit (HOSPITAL_BASED_OUTPATIENT_CLINIC_OR_DEPARTMENT_OTHER): Payer: Self-pay

## 2023-06-20 ENCOUNTER — Other Ambulatory Visit: Payer: Self-pay

## 2023-06-28 ENCOUNTER — Ambulatory Visit: Admitting: Podiatry

## 2023-06-28 ENCOUNTER — Other Ambulatory Visit: Payer: Self-pay

## 2023-07-21 ENCOUNTER — Other Ambulatory Visit: Payer: Self-pay | Admitting: Family Medicine

## 2023-07-21 ENCOUNTER — Other Ambulatory Visit: Payer: Self-pay

## 2023-07-21 DIAGNOSIS — E1169 Type 2 diabetes mellitus with other specified complication: Secondary | ICD-10-CM

## 2023-07-22 ENCOUNTER — Other Ambulatory Visit: Payer: Self-pay

## 2023-07-22 MED ORDER — ATORVASTATIN CALCIUM 40 MG PO TABS
40.0000 mg | ORAL_TABLET | Freq: Every day | ORAL | 0 refills | Status: DC
  Filled 2023-07-22: qty 90, 90d supply, fill #0

## 2023-08-09 ENCOUNTER — Telehealth: Payer: Self-pay | Admitting: Nurse Practitioner

## 2023-08-09 NOTE — Telephone Encounter (Signed)
 Contacted pt no vm to leave a message regarding his appt he sch MYCHART next f/u appt not until sept

## 2023-08-10 ENCOUNTER — Ambulatory Visit: Admitting: Nurse Practitioner

## 2023-08-10 ENCOUNTER — Other Ambulatory Visit: Payer: Self-pay | Admitting: Nurse Practitioner

## 2023-08-10 ENCOUNTER — Other Ambulatory Visit: Payer: Self-pay

## 2023-08-10 ENCOUNTER — Encounter: Payer: Self-pay | Admitting: Pharmacist

## 2023-08-10 DIAGNOSIS — K219 Gastro-esophageal reflux disease without esophagitis: Secondary | ICD-10-CM

## 2023-08-12 ENCOUNTER — Other Ambulatory Visit: Payer: Self-pay

## 2023-08-22 ENCOUNTER — Other Ambulatory Visit: Payer: Self-pay | Admitting: Nurse Practitioner

## 2023-08-22 ENCOUNTER — Other Ambulatory Visit: Payer: Self-pay

## 2023-08-22 DIAGNOSIS — K219 Gastro-esophageal reflux disease without esophagitis: Secondary | ICD-10-CM

## 2023-08-22 DIAGNOSIS — E1165 Type 2 diabetes mellitus with hyperglycemia: Secondary | ICD-10-CM

## 2023-08-22 MED ORDER — OZEMPIC (2 MG/DOSE) 8 MG/3ML ~~LOC~~ SOPN
2.0000 mg | PEN_INJECTOR | SUBCUTANEOUS | 2 refills | Status: DC
Start: 2023-08-22 — End: 2023-11-18
  Filled 2023-08-22: qty 3, 28d supply, fill #0
  Filled 2023-09-15: qty 3, 28d supply, fill #1
  Filled 2023-10-19: qty 3, 28d supply, fill #2

## 2023-08-22 MED ORDER — OMEPRAZOLE 20 MG PO CPDR
20.0000 mg | DELAYED_RELEASE_CAPSULE | Freq: Two times a day (BID) | ORAL | 0 refills | Status: DC
  Filled 2023-08-22: qty 180, 90d supply, fill #0

## 2023-08-23 ENCOUNTER — Other Ambulatory Visit: Payer: Self-pay

## 2023-08-26 ENCOUNTER — Other Ambulatory Visit: Payer: Self-pay

## 2023-09-15 ENCOUNTER — Other Ambulatory Visit: Payer: Self-pay

## 2023-09-16 ENCOUNTER — Other Ambulatory Visit: Payer: Self-pay

## 2023-09-19 ENCOUNTER — Other Ambulatory Visit: Payer: Self-pay

## 2023-09-22 ENCOUNTER — Other Ambulatory Visit: Payer: Self-pay

## 2023-09-23 ENCOUNTER — Other Ambulatory Visit: Payer: Self-pay

## 2023-09-23 ENCOUNTER — Other Ambulatory Visit: Payer: Self-pay | Admitting: Family Medicine

## 2023-09-23 MED ORDER — LANTUS SOLOSTAR 100 UNIT/ML ~~LOC~~ SOPN
26.0000 [IU] | PEN_INJECTOR | Freq: Every day | SUBCUTANEOUS | 0 refills | Status: DC
  Filled 2023-09-23: qty 9, 34d supply, fill #0

## 2023-09-26 ENCOUNTER — Other Ambulatory Visit: Payer: Self-pay | Admitting: Family Medicine

## 2023-09-26 ENCOUNTER — Other Ambulatory Visit: Payer: Self-pay

## 2023-09-26 DIAGNOSIS — Z794 Long term (current) use of insulin: Secondary | ICD-10-CM

## 2023-09-26 DIAGNOSIS — E1165 Type 2 diabetes mellitus with hyperglycemia: Secondary | ICD-10-CM

## 2023-09-27 ENCOUNTER — Other Ambulatory Visit: Payer: Self-pay

## 2023-09-27 MED ORDER — METFORMIN HCL 500 MG PO TABS
1000.0000 mg | ORAL_TABLET | Freq: Two times a day (BID) | ORAL | 0 refills | Status: DC
  Filled 2023-09-27: qty 360, 90d supply, fill #0

## 2023-09-28 ENCOUNTER — Other Ambulatory Visit: Payer: Self-pay

## 2023-10-05 ENCOUNTER — Other Ambulatory Visit: Payer: Self-pay

## 2023-10-12 ENCOUNTER — Ambulatory Visit: Admitting: Orthopaedic Surgery

## 2023-10-19 ENCOUNTER — Other Ambulatory Visit: Payer: Self-pay | Admitting: Family Medicine

## 2023-10-19 DIAGNOSIS — E1169 Type 2 diabetes mellitus with other specified complication: Secondary | ICD-10-CM

## 2023-10-20 ENCOUNTER — Other Ambulatory Visit: Payer: Self-pay

## 2023-10-20 MED ORDER — ATORVASTATIN CALCIUM 40 MG PO TABS
40.0000 mg | ORAL_TABLET | Freq: Every day | ORAL | 0 refills | Status: DC
  Filled 2023-10-20: qty 30, 30d supply, fill #0

## 2023-10-20 NOTE — Telephone Encounter (Signed)
 Requested medications are due for refill today.  yes  Requested medications are on the active medications list.  yes  Last refill. 07/22/2023 #90 0 rf  Future visit scheduled.   yes  Notes to clinic.  Labs are expired.    Requested Prescriptions  Pending Prescriptions Disp Refills   atorvastatin  (LIPITOR) 40 MG tablet 90 tablet 0    Sig: Take 1 tablet (40 mg total) by mouth daily. After evening meal to lower cholesterol     Cardiovascular:  Antilipid - Statins Failed - 10/20/2023  1:14 PM      Failed - Lipid Panel in normal range within the last 12 months    Cholesterol, Total  Date Value Ref Range Status  05/05/2022 195 100 - 199 mg/dL Final   LDL Chol Calc (NIH)  Date Value Ref Range Status  05/05/2022 120 (H) 0 - 99 mg/dL Final   HDL  Date Value Ref Range Status  05/05/2022 58 >39 mg/dL Final   Triglycerides  Date Value Ref Range Status  05/05/2022 97 0 - 149 mg/dL Final         Passed - Patient is not pregnant      Passed - Valid encounter within last 12 months    Recent Outpatient Visits           5 months ago Type 2 diabetes mellitus with hyperglycemia, with long-term current use of insulin  (HCC)   [facility] [place] Theotis Haze ORN, NP   9 months ago Primary hypertension   [facility] [place]. [facility] Theotis Haze ORN, NP   12 months ago Primary hypertension   [facility] [place]. [facility] [place], [place] W, NP   1 year ago Type 2 diabetes mellitus with hyperglycemia, with long-term current use of insulin  [facility])   [facility] [place]. [facility] [place], [place], [place]   1 year ago Type 2 diabetes mellitus with hyperglycemia, with long-term current use of insulin  [facility])   [facility] Theotis Haze ORN, NP        Future Appointments             In 2 weeks Vernetta Lonni GRADE, MD [facility]   In 3 weeks Theotis Haze ORN, NP [facility] Dept Of [place]. [facility], [place]

## 2023-10-21 ENCOUNTER — Other Ambulatory Visit: Payer: Self-pay

## 2023-11-02 ENCOUNTER — Other Ambulatory Visit: Payer: Self-pay

## 2023-11-02 ENCOUNTER — Other Ambulatory Visit: Payer: Self-pay | Admitting: Family Medicine

## 2023-11-02 MED ORDER — LANTUS SOLOSTAR 100 UNIT/ML ~~LOC~~ SOPN
26.0000 [IU] | PEN_INJECTOR | Freq: Every day | SUBCUTANEOUS | 0 refills | Status: DC
  Filled 2023-11-02: qty 9, 34d supply, fill #0

## 2023-11-07 ENCOUNTER — Other Ambulatory Visit: Payer: Self-pay

## 2023-11-09 ENCOUNTER — Other Ambulatory Visit: Payer: Self-pay

## 2023-11-09 ENCOUNTER — Ambulatory Visit: Admitting: Orthopaedic Surgery

## 2023-11-09 ENCOUNTER — Other Ambulatory Visit (INDEPENDENT_AMBULATORY_CARE_PROVIDER_SITE_OTHER): Payer: Self-pay

## 2023-11-09 ENCOUNTER — Telehealth: Payer: Self-pay | Admitting: Nurse Practitioner

## 2023-11-09 DIAGNOSIS — M549 Dorsalgia, unspecified: Secondary | ICD-10-CM

## 2023-11-09 DIAGNOSIS — G8929 Other chronic pain: Secondary | ICD-10-CM | POA: Diagnosis not present

## 2023-11-09 DIAGNOSIS — M25512 Pain in left shoulder: Secondary | ICD-10-CM | POA: Diagnosis not present

## 2023-11-09 MED ORDER — LIDOCAINE HCL 1 % IJ SOLN
3.0000 mL | INTRAMUSCULAR | Status: AC | PRN
  Administered 2023-11-09: 3 mL

## 2023-11-09 MED ORDER — METHYLPREDNISOLONE ACETATE 40 MG/ML IJ SUSP
40.0000 mg | INTRAMUSCULAR | Status: AC | PRN
  Administered 2023-11-09: 40 mg via INTRA_ARTICULAR

## 2023-11-09 MED ORDER — TIZANIDINE HCL 4 MG PO TABS
4.0000 mg | ORAL_TABLET | Freq: Three times a day (TID) | ORAL | 0 refills | Status: AC | PRN
  Filled 2023-11-09: qty 30, 10d supply, fill #0

## 2023-11-09 NOTE — Telephone Encounter (Signed)
 Pt confirmed appt 9/26 (per vr )
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
## 2023-11-11 ENCOUNTER — Other Ambulatory Visit: Payer: Self-pay

## 2023-11-11 ENCOUNTER — Encounter: Payer: Self-pay | Admitting: Nurse Practitioner

## 2023-11-11 ENCOUNTER — Ambulatory Visit
Admission: RE | Admit: 2023-11-11 | Discharge: 2023-11-11 | Disposition: A | Discharge status: Home / Self Care | Source: Ambulatory Visit | Attending: Nurse Practitioner | Admitting: Nurse Practitioner

## 2023-11-11 ENCOUNTER — Ambulatory Visit: Attending: Nurse Practitioner | Admitting: Nurse Practitioner

## 2023-11-11 VITALS — BP 116/83 | HR 97 | Ht 67.0 in | Wt 252.4 lb

## 2023-11-11 DIAGNOSIS — F419 Anxiety disorder, unspecified: Secondary | ICD-10-CM

## 2023-11-11 DIAGNOSIS — N528 Other male erectile dysfunction: Secondary | ICD-10-CM | POA: Diagnosis not present

## 2023-11-11 DIAGNOSIS — G8929 Other chronic pain: Secondary | ICD-10-CM

## 2023-11-11 DIAGNOSIS — F32A Depression, unspecified: Secondary | ICD-10-CM

## 2023-11-11 DIAGNOSIS — Z23 Encounter for immunization: Secondary | ICD-10-CM

## 2023-11-11 DIAGNOSIS — E559 Vitamin D deficiency, unspecified: Secondary | ICD-10-CM | POA: Diagnosis not present

## 2023-11-11 DIAGNOSIS — I10 Essential (primary) hypertension: Secondary | ICD-10-CM | POA: Diagnosis not present

## 2023-11-11 DIAGNOSIS — E1165 Type 2 diabetes mellitus with hyperglycemia: Secondary | ICD-10-CM

## 2023-11-11 DIAGNOSIS — Z794 Long term (current) use of insulin: Secondary | ICD-10-CM

## 2023-11-11 DIAGNOSIS — Z1211 Encounter for screening for malignant neoplasm of colon: Secondary | ICD-10-CM

## 2023-11-11 DIAGNOSIS — R0981 Nasal congestion: Secondary | ICD-10-CM

## 2023-11-11 DIAGNOSIS — M5442 Lumbago with sciatica, left side: Secondary | ICD-10-CM

## 2023-11-11 DIAGNOSIS — M5441 Lumbago with sciatica, right side: Secondary | ICD-10-CM

## 2023-11-11 LAB — POCT GLYCOSYLATED HEMOGLOBIN (HGB A1C): HbA1c, POC (controlled diabetic range): 8 % — AB (ref 0.0–7.0)

## 2023-11-11 MED ORDER — TADALAFIL 5 MG PO TABS
5.0000 mg | ORAL_TABLET | Freq: Every day | ORAL | 11 refills | Status: AC
  Filled 2023-11-11: qty 30, 30d supply, fill #0
  Filled 2023-12-28: qty 30, 30d supply, fill #1
  Filled 2024-02-03: qty 30, 30d supply, fill #2

## 2023-11-11 MED ORDER — FREESTYLE LIBRE 3 PLUS SENSOR MISC
6 refills | Status: AC
  Filled 2023-11-11: qty 2, fill #0

## 2023-11-11 MED ORDER — FREESTYLE LIBRE 3 READER DEVI
0 refills | Status: AC
  Filled 2023-11-11: qty 1, fill #0

## 2023-11-11 MED ORDER — VALSARTAN-HYDROCHLOROTHIAZIDE 160-25 MG PO TABS
1.0000 | ORAL_TABLET | Freq: Every day | ORAL | 3 refills | Status: AC
  Filled 2023-11-11 – 2024-02-03 (×2): qty 90, 90d supply, fill #0

## 2023-11-11 MED ORDER — DEXCOM G7 SENSOR MISC
6 refills | Status: AC
  Filled 2023-11-11 (×2): qty 3, 30d supply, fill #0
  Filled 2023-12-12: qty 3, 30d supply, fill #1
  Filled 2024-01-16: qty 3, 30d supply, fill #2
  Filled 2024-02-22: qty 3, 30d supply, fill #3

## 2023-11-11 MED ORDER — IPRATROPIUM BROMIDE 0.03 % NA SOLN
2.0000 | Freq: Two times a day (BID) | NASAL | 12 refills | Status: AC
  Filled 2023-11-11: qty 30, 75d supply, fill #0
  Filled 2024-02-03: qty 30, 75d supply, fill #1

## 2023-11-11 MED ORDER — LANTUS SOLOSTAR 100 UNIT/ML ~~LOC~~ SOPN
30.0000 [IU] | PEN_INJECTOR | Freq: Every day | SUBCUTANEOUS | 6 refills | Status: AC
  Filled 2023-11-11 – 2023-12-12 (×2): qty 15, 50d supply, fill #0
  Filled 2024-02-03: qty 15, 50d supply, fill #1

## 2023-11-11 MED ORDER — DEXCOM G7 RECEIVER DEVI
0 refills | Status: AC
  Filled 2023-11-11: qty 1, 30d supply, fill #0

## 2023-11-11 NOTE — Patient Instructions (Addendum)
 [facility] Located in: [place] [address] Address: [address], [place], [place] [address] Phone: [phone]    [facility] [address]  [phone]
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
## 2023-11-13 LAB — CBC WITH DIFFERENTIAL/PLATELET
Basophils Absolute: 0 x10E3/uL (ref 0.0–0.2)
Basos: 0 %
EOS (ABSOLUTE): 0.1 x10E3/uL (ref 0.0–0.4)
Eos: 1 %
Hematocrit: 47.2 % (ref 37.5–51.0)
Hemoglobin: 15.1 g/dL (ref 13.0–17.7)
Immature Grans (Abs): 0 x10E3/uL (ref 0.0–0.1)
Immature Granulocytes: 0 %
Lymphocytes Absolute: 2 x10E3/uL (ref 0.7–3.1)
Lymphs: 29 %
MCH: 26.4 pg — ABNORMAL LOW (ref 26.6–33.0)
MCHC: 32 g/dL (ref 31.5–35.7)
MCV: 82 fL (ref 79–97)
Monocytes Absolute: 0.8 x10E3/uL (ref 0.1–0.9)
Monocytes: 11 %
Neutrophils Absolute: 3.9 x10E3/uL (ref 1.4–7.0)
Neutrophils: 58 %
Platelets: 349 x10E3/uL (ref 150–450)
RBC: 5.73 x10E6/uL (ref 4.14–5.80)
RDW: 14.1 % (ref 11.6–15.4)
WBC: 6.8 x10E3/uL (ref 3.4–10.8)

## 2023-11-13 LAB — CMP14+EGFR
ALT: 36 IU/L (ref 0–44)
AST: 31 IU/L (ref 0–40)
Albumin: 4.9 g/dL (ref 4.1–5.1)
Alkaline Phosphatase: 91 IU/L (ref 47–123)
BUN/Creatinine Ratio: 16 (ref 9–20)
BUN: 17 mg/dL (ref 6–24)
Bilirubin Total: 0.7 mg/dL (ref 0.0–1.2)
CO2: 24 mmol/L (ref 20–29)
Calcium: 11 mg/dL — ABNORMAL HIGH (ref 8.7–10.2)
Chloride: 95 mmol/L — ABNORMAL LOW (ref 96–106)
Creatinine, Ser: 1.04 mg/dL (ref 0.76–1.27)
Globulin, Total: 3.1 g/dL (ref 1.5–4.5)
Glucose: 116 mg/dL — ABNORMAL HIGH (ref 70–99)
Potassium: 4.3 mmol/L (ref 3.5–5.2)
Sodium: 139 mmol/L (ref 134–144)
Total Protein: 8 g/dL (ref 6.0–8.5)
eGFR: 90 mL/min/1.73 (ref >59)

## 2023-11-13 LAB — MICROALBUMIN / CREATININE URINE RATIO
Creatinine, Urine: 296.5 mg/dL
Microalb/Creat Ratio: 12 mg/g{creat} (ref 0–29)
Microalbumin, Urine: 35.4 ug/mL

## 2023-11-13 LAB — VITAMIN D 25 HYDROXY (VIT D DEFICIENCY, FRACTURES): Vit D, 25-Hydroxy: 41.2 ng/mL (ref 30.0–100.0)

## 2023-11-14 ENCOUNTER — Other Ambulatory Visit: Payer: Self-pay

## 2023-11-16 ENCOUNTER — Other Ambulatory Visit: Payer: Self-pay

## 2023-11-16 ENCOUNTER — Ambulatory Visit: Payer: Self-pay | Admitting: Nurse Practitioner

## 2023-11-17 ENCOUNTER — Other Ambulatory Visit: Payer: Self-pay

## 2023-11-18 ENCOUNTER — Other Ambulatory Visit: Payer: Self-pay | Admitting: Family Medicine

## 2023-11-18 ENCOUNTER — Other Ambulatory Visit: Payer: Self-pay

## 2023-11-18 DIAGNOSIS — E1165 Type 2 diabetes mellitus with hyperglycemia: Secondary | ICD-10-CM

## 2023-11-18 MED ORDER — OZEMPIC (2 MG/DOSE) 8 MG/3ML ~~LOC~~ SOPN
2.0000 mg | PEN_INJECTOR | SUBCUTANEOUS | 2 refills | Status: AC
  Filled 2023-11-18: qty 3, 28d supply, fill #0
  Filled 2023-12-28: qty 3, 28d supply, fill #1
  Filled 2024-02-03: qty 3, 28d supply, fill #2

## 2023-11-25 ENCOUNTER — Other Ambulatory Visit: Payer: Self-pay

## 2023-11-28 ENCOUNTER — Other Ambulatory Visit: Payer: Self-pay

## 2023-11-28 ENCOUNTER — Other Ambulatory Visit: Payer: Self-pay | Admitting: Nurse Practitioner

## 2023-11-28 ENCOUNTER — Other Ambulatory Visit: Payer: Self-pay | Admitting: Family Medicine

## 2023-11-28 DIAGNOSIS — F32A Depression, unspecified: Secondary | ICD-10-CM

## 2023-11-28 DIAGNOSIS — E1169 Type 2 diabetes mellitus with other specified complication: Secondary | ICD-10-CM

## 2023-11-28 DIAGNOSIS — K219 Gastro-esophageal reflux disease without esophagitis: Secondary | ICD-10-CM

## 2023-11-28 MED ORDER — ATORVASTATIN CALCIUM 40 MG PO TABS
40.0000 mg | ORAL_TABLET | Freq: Every day | ORAL | 1 refills | Status: AC
  Filled 2023-11-28: qty 90, 90d supply, fill #0
  Filled 2024-02-14: qty 90, 90d supply, fill #1

## 2023-11-28 MED ORDER — OMEPRAZOLE 20 MG PO CPDR
20.0000 mg | DELAYED_RELEASE_CAPSULE | Freq: Two times a day (BID) | ORAL | 0 refills | Status: AC
  Filled 2023-11-28: qty 180, 90d supply, fill #0

## 2023-11-28 MED ORDER — BUSPIRONE HCL 15 MG PO TABS
15.0000 mg | ORAL_TABLET | Freq: Two times a day (BID) | ORAL | 0 refills | Status: AC
  Filled 2023-11-28: qty 60, 30d supply, fill #0

## 2023-11-29 ENCOUNTER — Other Ambulatory Visit: Payer: Self-pay

## 2023-11-30 ENCOUNTER — Other Ambulatory Visit: Payer: Self-pay

## 2023-11-30 ENCOUNTER — Other Ambulatory Visit

## 2023-12-12 ENCOUNTER — Other Ambulatory Visit: Payer: Self-pay

## 2023-12-13 ENCOUNTER — Ambulatory Visit: Admitting: Pharmacist

## 2023-12-19 ENCOUNTER — Encounter: Payer: Self-pay | Admitting: Radiology

## 2023-12-28 ENCOUNTER — Other Ambulatory Visit: Payer: Self-pay

## 2023-12-28 ENCOUNTER — Other Ambulatory Visit: Payer: Self-pay | Admitting: Family Medicine

## 2023-12-28 DIAGNOSIS — E1165 Type 2 diabetes mellitus with hyperglycemia: Secondary | ICD-10-CM

## 2023-12-29 ENCOUNTER — Other Ambulatory Visit: Payer: Self-pay

## 2023-12-29 MED ORDER — METFORMIN HCL 500 MG PO TABS
1000.0000 mg | ORAL_TABLET | Freq: Two times a day (BID) | ORAL | 0 refills | Status: AC
  Filled 2023-12-29: qty 360, 90d supply, fill #0

## 2024-01-05 ENCOUNTER — Other Ambulatory Visit: Payer: Self-pay

## 2024-01-16 ENCOUNTER — Other Ambulatory Visit: Payer: Self-pay

## 2024-02-03 ENCOUNTER — Other Ambulatory Visit: Payer: Self-pay | Admitting: Nurse Practitioner

## 2024-02-03 DIAGNOSIS — E1165 Type 2 diabetes mellitus with hyperglycemia: Secondary | ICD-10-CM

## 2024-02-03 DIAGNOSIS — M79651 Pain in right thigh: Secondary | ICD-10-CM

## 2024-02-06 ENCOUNTER — Other Ambulatory Visit: Payer: Self-pay

## 2024-02-06 MED ORDER — GABAPENTIN 300 MG PO CAPS
300.0000 mg | ORAL_CAPSULE | Freq: Three times a day (TID) | ORAL | 3 refills | Status: AC
  Filled 2024-02-06: qty 90, 30d supply, fill #0

## 2024-02-10 ENCOUNTER — Telehealth: Payer: Self-pay | Admitting: Nurse Practitioner

## 2024-02-10 NOTE — Telephone Encounter (Signed)
 Contacted patient; unable to reach. Left voicemail with appointment details.

## 2024-02-13 ENCOUNTER — Ambulatory Visit: Admitting: Nurse Practitioner

## 2024-02-13 ENCOUNTER — Ambulatory Visit (HOSPITAL_COMMUNITY): Admitting: Family

## 2024-02-14 ENCOUNTER — Other Ambulatory Visit (HOSPITAL_COMMUNITY): Payer: Self-pay

## 2024-02-14 ENCOUNTER — Other Ambulatory Visit: Payer: Self-pay | Admitting: Nurse Practitioner

## 2024-02-14 ENCOUNTER — Other Ambulatory Visit: Payer: Self-pay

## 2024-02-14 DIAGNOSIS — I10 Essential (primary) hypertension: Secondary | ICD-10-CM

## 2024-02-14 MED ORDER — AMLODIPINE BESYLATE 10 MG PO TABS
10.0000 mg | ORAL_TABLET | Freq: Every day | ORAL | 0 refills | Status: AC
  Filled 2024-02-14 (×2): qty 30, 30d supply, fill #0

## 2024-02-22 ENCOUNTER — Other Ambulatory Visit: Payer: Self-pay

## 2024-02-23 ENCOUNTER — Other Ambulatory Visit: Payer: Self-pay

## 2024-02-27 ENCOUNTER — Other Ambulatory Visit: Payer: Self-pay

## 2024-03-13 ENCOUNTER — Telehealth: Payer: Self-pay | Admitting: Nurse Practitioner

## 2024-03-13 NOTE — Telephone Encounter (Signed)
 Contacted pt phone keep ringing no vm to confirmed appt

## 2024-03-14 ENCOUNTER — Ambulatory Visit: Admitting: Nurse Practitioner
